# Patient Record
Sex: Male | Born: 1955 | Race: Black or African American | Hispanic: No | Marital: Married | Smoking: Former smoker
Health system: Southern US, Community
[De-identification: ages and names within clinical notes are randomized; demographics above are authoritative.]

## PROBLEM LIST (undated history)

## (undated) DIAGNOSIS — R490 Dysphonia: Secondary | ICD-10-CM

## (undated) HISTORY — DX: Dysphonia: R49.0

## (undated) HISTORY — PX: TONSILLECTOMY: SUR1361

---

## 2007-01-17 LAB — HM COLONOSCOPY: HM Colonoscopy: NORMAL

## 2011-10-19 DIAGNOSIS — J385 Laryngeal spasm: Secondary | ICD-10-CM | POA: Insufficient documentation

## 2012-01-17 ENCOUNTER — Encounter: Payer: Self-pay | Admitting: Internal Medicine

## 2012-01-17 ENCOUNTER — Ambulatory Visit (INDEPENDENT_AMBULATORY_CARE_PROVIDER_SITE_OTHER): Payer: Federal, State, Local not specified - PPO | Admitting: Internal Medicine

## 2012-01-17 VITALS — BP 100/70 | HR 96 | Temp 98.2°F | Resp 16 | Ht 70.0 in | Wt 161.2 lb

## 2012-01-17 DIAGNOSIS — R634 Abnormal weight loss: Secondary | ICD-10-CM

## 2012-01-17 DIAGNOSIS — K5900 Constipation, unspecified: Secondary | ICD-10-CM

## 2012-01-17 DIAGNOSIS — R490 Dysphonia: Secondary | ICD-10-CM

## 2012-01-17 MED ORDER — MEGESTROL ACETATE 40 MG/ML PO SUSP: 200.0000 mg | Freq: Every day | ORAL | Status: DC

## 2012-01-17 NOTE — Patient Instructions (Addendum)
I recommend adding citrucel or another mixable fiber supplement as needed to achieve bowel movements at least 2 or 3 per week,  Non straining

## 2012-01-17 NOTE — Progress Notes (Signed)
Patient ID: Nicholas Singleton, male   DOB: [birth year], 56 y.o.   MRN: [id number]  Patient Active Problem List  Diagnoses  . Dysphonia  . Weight loss, unintentional  . Constipation    Subjective:  CC:   Chief Complaint  Patient presents with  . New Patient    HPI:   Nicholas Singleton a 56 y.o. male who presents Is a new patient to establish primary care.  He has history of dysphonia for 8 years.  The condition was diagnosed 5 yrs ago after he sought ENT evaluation for recurrent respiratory infections.  He was referred to a Psychiatrist who prescribed medications that helped relax his vocal cords but eventually found his way to a neurologist who diagnosed dysphona.  His treatment has involved multiple drug trials which have been tried with limited success, including 2 Botox injections, once in [place] , once in February here by prior neurologist. Currently sees Nicholas Singleton,  At [facility].   Has recurrent trouble   Still taking Artane and Xenisene.  Has no trouble breathing while asleep and he apparently does not  desaturate according to his recall of prior sleep studies ,  but when awake he has to concentrate on his breathing. Relocated here one year ago from [place]. But this is a return to [place].  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     Cc wt loss of 20 lbs in the last year since moving  To [place].  he has had decreased appetite due to his medications. He does drink boost supplement daily. He denies abdominal pain nausea and has not change in bowel movements with only one stool per week on average which he had treats his medications. He had a normal colonoscopy in 2008. He is up-to-date on prostate cancer screening as well.   Past Medical History  Diagnosis Date  . Dysphonia     Past Surgical History  Procedure Date  . Tonsillectomy          The following portions of the patient's history were reviewed  and updated as appropriate: Allergies, current medications, and problem list.    Review of Systems:   12 Pt  review of systems was negative except those addressed in the HPI,     History   Social History  . Marital Status: Married    Spouse Name: N/A    Number of Children: N/A  . Years of Education: N/A   Occupational History  . Not on file.   Social History Main Topics  . Smoking status: Never Smoker   . Smokeless tobacco: Never Used  . Alcohol Use: Yes  . Drug Use: No  . Sexually Active: Not on file   Other Topics Concern  . Not on file   Social History Narrative  . No narrative on file    Objective:  BP 100/70  Pulse 96  Temp(Src) 98.2 F (36.8 C) (Oral)  Resp 16  Ht 5\' 10"  (1.778 m)  Wt 161 lb 4 oz (73.143 kg)  BMI 23.14 kg/m2  SpO2 95%  General appearance: alert, cooperative and appears stated age Ears: normal TM's and external ear canals both ears Throat: lips, mucosa, and tongue normal; teeth and gums normal Neck: no adenopathy, no carotid bruit, supple, symmetrical, trachea midline and thyroid not enlarged, symmetric, no tenderness/mass/nodules Back: symmetric, no curvature. ROM normal. No CVA tenderness. Lungs: clear to auscultation bilaterally Heart:  regular rate and rhythm, S1, S2 normal, no murmur, click, rub or gallop Abdomen: soft, non-tender; bowel sounds normal; no masses,  no organomegaly Pulses: 2+ and symmetric Skin: Skin color, texture, turgor normal. No rashes or lesions Lymph nodes: Cervical, supraclavicular, and axillary nodes normal.  Assessment and Plan:  Weight loss, unintentional He feels that his weight loss, although unintentional, can be explained by his poor appetite. His medications cause him to lose his appetite. He is not underweight as his BMI is greater than 20. We discussed a trial of an appetite stimulating medication such as Megace. This fails to achieve some plateau and over the ankle in weight we will consider  workup for occult malignancy this was discussed with patient today  Dysphonia Managed by Nicholas Singleton with tetrabenazine and Artane.  Constipation His medications have been causing constipation he is married and one bowel movement per week. I recommended that he start using a fiber supplement on a daily basis with a goal to 3 stools per week. Also recommend that he increase his water intake to 11/05/2014 ounce bottles daily minimum.    Updated Medication List Outpatient Encounter Prescriptions as of 01/17/2012  Medication Sig Dispense Refill  . amitriptyline (ELAVIL) 10 MG tablet Take 10 mg by mouth at bedtime.      . Calcium Carbonate-Vit D-Min (CALTRATE 600+D PLUS) 600-400 MG-UNIT per tablet Take 1 tablet by mouth daily.      . Eszopiclone (ESZOPICLONE) 3 MG TABS Take 3 mg by mouth at bedtime. Take immediately before bedtime      . feeding supplement (BOOST HIGH PROTEIN) LIQD Take 1 Container by mouth 3 (three) times daily between meals.      . megestrol (MEGACE ORAL) 40 MG/ML suspension Take 5 mLs (200 mg total) by mouth daily.  480 mL  0  . Multiple Vitamin (MULTIVITAMIN) tablet Take 1 tablet by mouth daily.      Marland Kitchen tetrabenazine (XENAZINE) 25 MG tablet Take 25 mg by mouth 4 (four) times daily.      . trihexyphenidyl (ARTANE) 2 MG tablet Take 4 mg by mouth 4 (four) times daily.         Orders Placed This Encounter  Procedures  . HM COLONOSCOPY    No Follow-up on file.

## 2012-01-18 ENCOUNTER — Encounter: Payer: Self-pay | Admitting: Internal Medicine

## 2012-01-18 DIAGNOSIS — R490 Dysphonia: Secondary | ICD-10-CM | POA: Insufficient documentation

## 2012-01-18 DIAGNOSIS — K5909 Other constipation: Secondary | ICD-10-CM | POA: Insufficient documentation

## 2012-01-18 DIAGNOSIS — R634 Abnormal weight loss: Secondary | ICD-10-CM | POA: Insufficient documentation

## 2012-01-18 NOTE — Assessment & Plan Note (Addendum)
Managed by Dr. Jeannetta Nap with tetrabenazine and Artane.

## 2012-01-18 NOTE — Assessment & Plan Note (Signed)
His medications have been causing constipation he is married and one bowel movement per week. I recommended that he start using a fiber supplement on a daily basis with a goal to 3 stools per week. Also recommend that he increase his water intake to 11/05/2014 ounce bottles daily minimum.

## 2012-01-18 NOTE — Assessment & Plan Note (Signed)
He feels that his weight loss, although unintentional, can be explained by his poor appetite. His medications cause him to lose his appetite. He is not underweight as his BMI is greater than 20. We discussed a trial of an appetite stimulating medication such as Megace. This fails to achieve some plateau and over the ankle in weight we will consider workup for occult malignancy this was discussed with patient today

## 2012-02-01 ENCOUNTER — Telehealth: Payer: Self-pay | Admitting: Internal Medicine

## 2012-02-01 NOTE — Telephone Encounter (Signed)
Do you want me to fill out handicap sticker for patient?  Temporary or permanent?

## 2012-02-01 NOTE — Telephone Encounter (Signed)
Permament handicap sticker,  thanks

## 2012-02-01 NOTE — Telephone Encounter (Signed)
Patient called in states when he was here last week he spoke with Dr. Darrick Huntsman about a handicap sticker.  He would like to know the status on this.

## 2012-02-02 NOTE — Telephone Encounter (Signed)
Placard filled out, patient notified.

## 2012-02-29 ENCOUNTER — Other Ambulatory Visit (INDEPENDENT_AMBULATORY_CARE_PROVIDER_SITE_OTHER): Payer: Federal, State, Local not specified - PPO | Admitting: *Deleted

## 2012-02-29 ENCOUNTER — Telehealth: Payer: Self-pay | Admitting: Internal Medicine

## 2012-02-29 DIAGNOSIS — R5381 Other malaise: Secondary | ICD-10-CM

## 2012-02-29 DIAGNOSIS — E039 Hypothyroidism, unspecified: Secondary | ICD-10-CM

## 2012-02-29 DIAGNOSIS — E785 Hyperlipidemia, unspecified: Secondary | ICD-10-CM

## 2012-02-29 DIAGNOSIS — E46 Unspecified protein-calorie malnutrition: Secondary | ICD-10-CM

## 2012-02-29 DIAGNOSIS — R5383 Other fatigue: Secondary | ICD-10-CM

## 2012-02-29 NOTE — Telephone Encounter (Signed)
Message copied by Duncan Dull on Thu Feb 29, 2012  2:54 PM ------      Message from: Baldomero Lamy      Created: Thu Feb 29, 2012 10:00 AM      Regarding: Physical labs today       Pt had physical labs today, please order.            Thanks      Rodney Booze

## 2012-03-01 LAB — CBC WITH DIFFERENTIAL/PLATELET
Basophils Absolute: 0 10*3/uL (ref 0.0–0.1)
Eosinophils Absolute: 0 10*3/uL (ref 0.0–0.7)
Hemoglobin: 12.5 g/dL — ABNORMAL LOW (ref 13.0–17.0)
Lymphocytes Relative: 26.8 % (ref 12.0–46.0)
MCHC: 32.5 g/dL (ref 30.0–36.0)
Neutro Abs: 3.2 10*3/uL (ref 1.4–7.7)
Neutrophils Relative %: 69.3 % (ref 43.0–77.0)
RDW: 14.3 % (ref 11.5–14.6)

## 2012-03-01 LAB — COMPREHENSIVE METABOLIC PANEL
ALT: 25 U/L (ref 0–53)
AST: 24 U/L (ref 0–37)
Albumin: 4.3 g/dL (ref 3.5–5.2)
Calcium: 9.9 mg/dL (ref 8.4–10.5)
Chloride: 105 mEq/L (ref 96–112)
Potassium: 4.5 mEq/L (ref 3.5–5.1)
Sodium: 138 mEq/L (ref 135–145)

## 2012-03-01 LAB — LIPID PANEL
LDL Cholesterol: 122 mg/dL — ABNORMAL HIGH (ref 0–99)
Total CHOL/HDL Ratio: 3

## 2012-03-15 ENCOUNTER — Encounter: Payer: Federal, State, Local not specified - PPO | Admitting: Internal Medicine

## 2012-04-18 ENCOUNTER — Ambulatory Visit (INDEPENDENT_AMBULATORY_CARE_PROVIDER_SITE_OTHER): Payer: Federal, State, Local not specified - PPO | Admitting: Internal Medicine

## 2012-04-18 ENCOUNTER — Encounter: Payer: Self-pay | Admitting: Internal Medicine

## 2012-04-18 VITALS — BP 110/80 | HR 99 | Temp 97.8°F | Resp 14 | Wt 165.0 lb

## 2012-04-18 DIAGNOSIS — R634 Abnormal weight loss: Secondary | ICD-10-CM

## 2012-04-18 DIAGNOSIS — Z125 Encounter for screening for malignant neoplasm of prostate: Secondary | ICD-10-CM

## 2012-04-18 DIAGNOSIS — Z0000 Encounter for general adult medical examination without abnormal findings: Secondary | ICD-10-CM

## 2012-04-18 DIAGNOSIS — D649 Anemia, unspecified: Secondary | ICD-10-CM

## 2012-04-18 DIAGNOSIS — Z23 Encounter for immunization: Secondary | ICD-10-CM

## 2012-04-18 NOTE — Patient Instructions (Addendum)
Please return the take home stool test at your leisure as your screening or colon CA  We will call you with the results of your PSA   Return in 6 months,  Sooner if needed

## 2012-04-18 NOTE — Progress Notes (Signed)
Patient ID: Nicholas Singleton, male   DOB: 11-Mar-1956, 56 y.o.   MRN: [id number]   . Patient Active Problem List  Diagnosis  . Dysphonia  . Weight loss, unintentional  . Constipation, chronic  . Routine general medical examination at a health care facility    Subjective:  CC:   Chief Complaint  Patient presents with  . Annual Exam    HPI:   Nicholas Singleton a 56 y.o. male who presents for his annual male exam. He has no new complaints. He is a bit lethargic today because he took his morning meds quite late but also took his early afternoon meds at the usual at the usual time.  His constipation has improved with use of daily fiber laxative.   Past Medical History  Diagnosis Date  . Dysphonia     Past Surgical History  Procedure Date  . Tonsillectomy          The following portions of the patient's history were reviewed and updated as appropriate: Allergies, current medications, and problem list.    Review of Systems:   A comprehensive ROS was done and positive for improving constipation.   The rest was negative.      History   Social History  . Marital Status: Married    Spouse Name: N/A    Number of Children: N/A  . Years of Education: N/A   Occupational History  . Not on file.   Social History Main Topics  . Smoking status: Never Smoker   . Smokeless tobacco: Never Used  . Alcohol Use: Yes  . Drug Use: No  . Sexually Active: Not on file   Other Topics Concern  . Not on file   Social History Narrative  . No narrative on file    Objective:  BP 110/80  Pulse 99  Temp 97.8 F (36.6 C) (Oral)  Resp 14  Wt 165 lb (74.844 kg)  SpO2 98%  General Appearance:    Alert, cooperative, no distress, appears stated age  Head:    Normocephalic, without obvious abnormality, atraumatic  Eyes:    PERRL, conjunctiva/corneas clear, EOM's intact, fundi    benign, both eyes       Ears:    Normal TM's and external ear canals, both ears  Nose:   Nares normal,  septum midline, mucosa normal, no drainage   or sinus tenderness  Throat:   Lips, mucosa, and tongue normal; teeth and gums normal  Neck:   Supple, symmetrical, trachea midline, no adenopathy;       thyroid:  No enlargement/tenderness/nodules; no carotid   bruit or JVD  Back:     Symmetric, no curvature, ROM normal, no CVA tenderness  Lungs:     Clear to auscultation bilaterally, respirations unlabored  Chest wall:    No tenderness or deformity  Heart:    Regular rate and rhythm, S1 and S2 normal, no murmur, rub   or gallop  Abdomen:     Soft, non-tender, bowel sounds active all four quadrants,    no masses, no organomegaly  Genitalia:    Normal male without lesion, discharge or tenderness  Rectal:    Normal tone, normal prostate, no masses or tenderness;    Extremities:   Extremities normal, atraumatic, no cyanosis or edema  Pulses:   2+ and symmetric all extremities  Skin:   Skin color, texture, turgor normal, no rashes or lesions  Lymph nodes:   Cervical, supraclavicular, and axillary nodes normal  Neurologic:  CNII-XII intact. Normal strength, sensation and reflexes      throughout    Assessment and Plan:  Weight loss, unintentional His weight loss has resolved and he is actually gained 2 pounds since last visit. He attributes this to the appetite stimulant I prescribed. He's had no side effects from it.  Routine general medical examination at a health care facility Testicular and prostate exam were both done today during the patient's annual PE exam. There were no abnormalities noted.   Updated Medication List Outpatient Encounter Prescriptions as of 04/18/2012  Medication Sig Dispense Refill  . amitriptyline (ELAVIL) 10 MG tablet Take 10 mg by mouth at bedtime.      . Eszopiclone (ESZOPICLONE) 3 MG TABS Take 3 mg by mouth at bedtime. Take immediately before bedtime      . megestrol (MEGACE ORAL) 40 MG/ML suspension Take 5 mLs (200 mg total) by mouth daily.  480 mL  0  .  Multiple Vitamin (MULTIVITAMIN) tablet Take 1 tablet by mouth daily.      Marland Kitchen tetrabenazine (XENAZINE) 25 MG tablet Take 25 mg by mouth 4 (four) times daily.      . trihexyphenidyl (ARTANE) 2 MG tablet Take 4 mg by mouth 4 (four) times daily.      Marland Kitchen DISCONTD: Calcium Carbonate-Vit D-Min (CALTRATE 600+D PLUS) 600-400 MG-UNIT per tablet Take 1 tablet by mouth daily.      Marland Kitchen DISCONTD: feeding supplement (BOOST HIGH PROTEIN) LIQD Take 1 Container by mouth 3 (three) times daily between meals.         Orders Placed This Encounter  Procedures  . Tdap vaccine greater than or equal to 7yo IM  . PSA  . Ferritin  . Iron and TIBC  . Vitamin B12  . PSA, total and free    No Follow-up on file.      \

## 2012-04-19 LAB — PSA, TOTAL AND FREE
PSA, Free Pct: 21 % — ABNORMAL LOW (ref >25)
PSA, Free: 0.15 ng/mL

## 2012-04-19 LAB — IRON AND TIBC: Iron: 111 ug/dL (ref 42–165)

## 2012-04-21 DIAGNOSIS — Z0000 Encounter for general adult medical examination without abnormal findings: Secondary | ICD-10-CM | POA: Insufficient documentation

## 2012-04-21 NOTE — Assessment & Plan Note (Signed)
His weight loss has resolved and he is actually gained 2 pounds since last visit. He attributes this to the appetite stimulant I prescribed. He's had no side effects from it.

## 2012-04-21 NOTE — Assessment & Plan Note (Signed)
Testicular and prostate exam were both done today during the patient's annual PE exam. There were no abnormalities noted.

## 2012-10-21 ENCOUNTER — Ambulatory Visit (INDEPENDENT_AMBULATORY_CARE_PROVIDER_SITE_OTHER): Payer: Federal, State, Local not specified - PPO | Admitting: Internal Medicine

## 2012-10-21 ENCOUNTER — Encounter: Payer: Self-pay | Admitting: Internal Medicine

## 2012-10-21 VITALS — BP 120/78 | HR 75 | Temp 98.2°F | Resp 16 | Ht 67.25 in | Wt 173.2 lb

## 2012-10-21 DIAGNOSIS — R490 Dysphonia: Secondary | ICD-10-CM

## 2012-10-21 DIAGNOSIS — T50905A Adverse effect of unspecified drugs, medicaments and biological substances, initial encounter: Secondary | ICD-10-CM

## 2012-10-21 DIAGNOSIS — R638 Other symptoms and signs concerning food and fluid intake: Secondary | ICD-10-CM

## 2012-10-21 DIAGNOSIS — N529 Male erectile dysfunction, unspecified: Secondary | ICD-10-CM

## 2012-10-21 DIAGNOSIS — R635 Abnormal weight gain: Secondary | ICD-10-CM

## 2012-10-21 DIAGNOSIS — N522 Drug-induced erectile dysfunction: Secondary | ICD-10-CM

## 2012-10-21 DIAGNOSIS — R634 Abnormal weight loss: Secondary | ICD-10-CM

## 2012-10-21 MED ORDER — SILDENAFIL CITRATE 50 MG PO TABS: 50.0000 mg | ORAL_TABLET | Freq: Every day | ORAL | Status: DC | PRN

## 2012-10-21 NOTE — Progress Notes (Signed)
Patient ID: Nicholas Singleton, male   DOB: 1956-07-15, 57 y.o.   MRN: [id number]   Patient Active Problem List  Diagnosis  . Dysphonia  . Constipation, chronic  . Routine general medical examination at a health care facility  . Drug-induced impotence  . Weight gain due to medication    Subjective:  CC:   Chief Complaint  Patient presents with  . Follow-up    HPI:   Nicholas Singleton a 57 y.o. male who presents for 6 month follow up on chronic issues including insomnia, weight  loss and dysphonia. 1) he continues to mange his insomnia with elavil and lunesta, averages 8 hours per night.  He has gained 8 lbs since last visit.  He is no longer taking the megace and his appetite is healthy.  He exercises several days per week (about 3) on days when his dysphonia is minimal but continues to have several days per week when he is unable to do much.  The condition was diagnosed 5 yrs ago after he sought ENT evaluation for recurrent respiratory infections.  He was referred to a psychiatrist who prescribed medications that helped relax his vocal cords but eventually found his way to a neurologist who diagnosed dysphonia. He has no trouble breathing while asleep and he apparently does not  desaturate according to his recall of prior sleep studies. His treatment has involved multiple drug trials which resulted in limited success, including  Botox injections.  He currently sees Dr. Jeannetta Nap at [facility].   He continues to take Artane and Xenisene and is having difficulty maintaining erections due to the medications.  He is requesting a trial of medication for impotence.  He is also requesting referral to [facility]/ENT for ongoing management of dysphonia.    Past Medical History  Diagnosis Date  . Dysphonia     Past Surgical History  Procedure Laterality Date  . Tonsillectomy      The following portions of the patient's history were reviewed and updated as appropriate: Allergies,  current medications, and problem list.    Review of Systems:   Patient denies headache, fevers, malaise, unintentional weight loss, skin rash, eye pain, sinus congestion and sinus pain, sore throat, dysphagia,  hemoptysis , cough, dyspnea, wheezing, chest pain, palpitations, orthopnea, edema, abdominal pain, nausea, melena, diarrhea, constipation, flank pain, dysuria, hematuria, urinary  Frequency, nocturia, numbness, tingling, seizures,  Focal weakness, Loss of consciousness,  Tremor, insomnia, depression, anxiety, and suicidal ideation.     History   Social History  . Marital Status: Married    Spouse Name: N/A    Number of Children: N/A  . Years of Education: N/A   Occupational History  . attorney    Social History Main Topics  . Smoking status: Never Smoker   . Smokeless tobacco: Never Used  . Alcohol Use: Yes  . Drug Use: No  . Sexually Active: Not on file   Other Topics Concern  . Not on file   Social History Narrative  . No narrative on file    Objective:  BP 120/78  Pulse 75  Temp(Src) 98.2 F (36.8 C) (Oral)  Resp 16  Ht 5' 7.25" (1.708 m)  Wt 173 lb 4 oz (78.586 kg)  BMI 26.94 kg/m2  SpO2 99%  General appearance: alert, cooperative and appears stated age.  Ears: normal TM's and external ear canals both ears Throat: lips, mucosa, and tongue normal; teeth and gums normal Neck: no adenopathy, no carotid bruit, supple, symmetrical, trachea  midline and thyroid not enlarged, symmetric, no tenderness/mass/nodules Back: symmetric, no curvature. ROM normal. No CVA tenderness. Lungs: clear to auscultation bilaterally Heart: regular rate and rhythm, S1, S2 normal, no murmur, click, rub or gallop Abdomen: soft, non-tender; bowel sounds normal; no masses,  no organomegaly Pulses: 2+ and symmetric Skin: Skin color, texture, turgor normal. No rashes or lesions Lymph nodes: Cervical, supraclavicular, and axillary nodes normal.  Assessment and  Plan:  Dysphonia Managed with botox injections by ENT and neurologist in [facility]. Wants to have a second opinion with referral to [facility].  Will refer to Dr. Noel Gerold  At the [facility].  Records from [facility] were never received and requested today.   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  Risks and benefits of PDE's discussed.  Trial of viagra.   Weight gain due to medication 12 lb wt gain since first visit,  BMI now 26 .  Ideal weight discussed (165 lbs) and diet/exercise plan discussed.    Updated Medication List Outpatient Encounter Prescriptions as of 10/21/2012  Medication Sig Dispense Refill  . amitriptyline (ELAVIL) 10 MG tablet Take 10 mg by mouth at bedtime.      . Eszopiclone (ESZOPICLONE) 3 MG TABS Take 3 mg by mouth at bedtime. Take immediately before bedtime      . Multiple Vitamin (MULTIVITAMIN) tablet Take 1 tablet by mouth daily.      Marland Kitchen tetrabenazine (XENAZINE) 25 MG tablet Take 25 mg by mouth 4 (four) times daily.      . trihexyphenidyl (ARTANE) 2 MG tablet Take 4 mg by mouth 4 (four) times daily.      . [DISCONTINUED] megestrol (MEGACE ORAL) 40 MG/ML suspension Take 5 mLs (200 mg total) by mouth daily.  480 mL  0  . sildenafil (VIAGRA) 50 MG tablet Take 1 tablet (50 mg total) by mouth daily as needed for erectile dysfunction.  3 tablet  0  . [DISCONTINUED] sildenafil (VIAGRA) 50 MG tablet Take 1 tablet (50 mg total) by mouth daily as needed for erectile dysfunction.  10 tablet  0   No facility-administered encounter medications on file as of 10/21/2012.     Orders Placed This Encounter  Procedures  . Ambulatory referral to ENT    No Follow-up on file.

## 2012-10-21 NOTE — Assessment & Plan Note (Signed)
12 lb wt gain since first visit,  BMI now 26 .  Ideal weight discussed (165 lbs) and diet/exercise plan discussed.

## 2012-10-21 NOTE — Assessment & Plan Note (Signed)
Secondary to meds.  Risks and benefits of PDE's discussed.  Trial of viagra.

## 2012-10-21 NOTE — Patient Instructions (Addendum)
I recommend one calcium supplement daily one MVI  (goal 800 IUs of Vit d daily )    Referral to Lenard Simmer  At the [facility] (he is the head)  Your exercise goal is a minimum  Of 30 minutes of aerobic exercise 5 days per week

## 2012-10-21 NOTE — Assessment & Plan Note (Addendum)
Managed with botox injections by ENT and neurologist in [facility]. Wants to have a second opinion with referral to [facility].  Will refer to Dr. Noel Gerold  At the [facility].  Records from [facility] were never received and requested today.

## 2012-11-05 ENCOUNTER — Telehealth: Payer: Self-pay | Admitting: *Deleted

## 2012-11-05 NOTE — Telephone Encounter (Signed)
Pt is coming in for labs tomorrow 03.05.2014 what labs and dx would you like? Thank you  

## 2012-11-06 ENCOUNTER — Other Ambulatory Visit (INDEPENDENT_AMBULATORY_CARE_PROVIDER_SITE_OTHER): Payer: Federal, State, Local not specified - PPO

## 2012-11-06 LAB — LIPID PANEL
HDL: 57 mg/dL (ref >39.00)
Total CHOL/HDL Ratio: 4
Triglycerides: 55 mg/dL (ref 0.0–149.0)

## 2012-11-06 LAB — COMPREHENSIVE METABOLIC PANEL
ALT: 30 U/L (ref 0–53)
CO2: 27 mEq/L (ref 19–32)
Calcium: 9.4 mg/dL (ref 8.4–10.5)
Chloride: 101 mEq/L (ref 96–112)
Creatinine, Ser: 1.4 mg/dL (ref 0.4–1.5)
GFR: 69.54 mL/min (ref >60.00)
Glucose, Bld: 89 mg/dL (ref 70–99)
Total Bilirubin: 0.6 mg/dL (ref 0.3–1.2)
Total Protein: 7.7 g/dL (ref 6.0–8.3)

## 2012-11-06 LAB — CBC WITH DIFFERENTIAL/PLATELET
Basophils Absolute: 0 10*3/uL (ref 0.0–0.1)
Eosinophils Relative: 0.7 % (ref 0.0–5.0)
HCT: 39.8 % (ref 39.0–52.0)
Hemoglobin: 13.1 g/dL (ref 13.0–17.0)
Lymphocytes Relative: 28.8 % (ref 12.0–46.0)
Lymphs Abs: 1.4 10*3/uL (ref 0.7–4.0)
Monocytes Relative: 7.9 % (ref 3.0–12.0)
Neutro Abs: 3.1 10*3/uL (ref 1.4–7.7)
RBC: 4.61 Mil/uL (ref 4.22–5.81)
RDW: 14.5 % (ref 11.5–14.6)
WBC: 5 10*3/uL (ref 4.5–10.5)

## 2012-11-06 LAB — LDL CHOLESTEROL, DIRECT: Direct LDL: 155.3 mg/dL

## 2012-11-06 NOTE — Telephone Encounter (Signed)
Orders in 

## 2012-11-06 NOTE — Addendum Note (Signed)
Addended by: Sherlene Shams on: 11/06/2012 07:09 AM   Modules accepted: Orders

## 2012-11-28 DIAGNOSIS — G249 Dystonia, unspecified: Secondary | ICD-10-CM | POA: Insufficient documentation

## 2013-08-04 ENCOUNTER — Emergency Department: Payer: Self-pay | Admitting: Emergency Medicine

## 2013-08-26 DIAGNOSIS — R4189 Other symptoms and signs involving cognitive functions and awareness: Secondary | ICD-10-CM | POA: Insufficient documentation

## 2013-11-19 ENCOUNTER — Encounter: Payer: Self-pay | Admitting: Internal Medicine

## 2013-11-19 ENCOUNTER — Encounter (INDEPENDENT_AMBULATORY_CARE_PROVIDER_SITE_OTHER): Payer: Self-pay

## 2013-11-19 ENCOUNTER — Ambulatory Visit (INDEPENDENT_AMBULATORY_CARE_PROVIDER_SITE_OTHER): Payer: Federal, State, Local not specified - PPO | Admitting: Internal Medicine

## 2013-11-19 VITALS — BP 108/76 | HR 81 | Temp 98.4°F | Resp 18 | Wt 169.5 lb

## 2013-11-19 DIAGNOSIS — Z79899 Other long term (current) drug therapy: Secondary | ICD-10-CM

## 2013-11-19 DIAGNOSIS — B351 Tinea unguium: Secondary | ICD-10-CM

## 2013-11-19 LAB — COMPREHENSIVE METABOLIC PANEL
ALBUMIN: 4.4 g/dL (ref 3.5–5.2)
ALT: 39 U/L (ref 0–53)
AST: 29 U/L (ref 0–37)
Alkaline Phosphatase: 52 U/L (ref 39–117)
BUN: 15 mg/dL (ref 6–23)
CALCIUM: 9.8 mg/dL (ref 8.4–10.5)
CHLORIDE: 101 meq/L (ref 96–112)
CO2: 32 meq/L (ref 19–32)
CREATININE: 1.01 mg/dL (ref 0.50–1.35)
Glucose, Bld: 79 mg/dL (ref 70–99)
POTASSIUM: 4.7 meq/L (ref 3.5–5.3)
Sodium: 137 mEq/L (ref 135–145)
TOTAL PROTEIN: 7.4 g/dL (ref 6.0–8.3)
Total Bilirubin: 0.4 mg/dL (ref 0.2–1.2)

## 2013-11-19 NOTE — Progress Notes (Signed)
Patient ID: Nicholas Singleton, male   DOB: [birth year], 59 y.o.   MRN: [id number]  Patient Active Problem List   Diagnosis Date Noted  . Onychomycosis 11/20/2013  . 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 Weight gain due to medication 10/21/2012  . Routine general medical examination at a health care facility 04/21/2012  . Constipation, chronic 01/18/2012  . Dysphonia     Subjective:  CC:   Chief Complaint  Patient presents with  . Acute Visit    all toes are infected stated by patient.    HPI:   Nicholas Singleton is a 58 y.o. male who presents for  Follow up on treatment for Onychomycosis that was under the management by his  podiatrist who died a month ago.  Dr Jetta Lout in [place]  Prescribed  Medication in December 8th after culture was obtained during surgery in November to remove corns  From right foot,  He has been taking terbinafine and ketoconazole for about two months and hasn't seen a difference. Toe nails are very slow growing. Wants to know if there is a more effective treatment , specifically Wants to inquire about the publicly advertised jubilee      Past Medical History  Diagnosis Date  . Dysphonia     Past Surgical History  Procedure Laterality Date  . Tonsillectomy         The following portions of the patient's history were reviewed and updated as appropriate: Allergies, current medications, and problem list.    Review of Systems:   Patient denies headache, fevers, malaise, unintentional weight loss, skin rash, eye pain, sinus congestion and sinus pain, sore throat, dysphagia,  hemoptysis , cough, dyspnea, wheezing, chest pain, palpitations, orthopnea, edema, abdominal pain, nausea, melena, diarrhea, constipation, flank pain, dysuria, hematuria, urinary  Frequency, nocturia, numbness, tingling, seizures,  Focal weakness, Loss of consciousness,  Tremor, insomnia, depression, anxiety, and suicidal ideation.     History   Social History  . Marital Status:  Married    Spouse Name: N/A    Number of Children: N/A  . Years of Education: N/A   Occupational History  . attorney    Social History Main Topics  . Smoking status: Never Smoker   . Smokeless tobacco: Never Used  . Alcohol Use: Yes  . Drug Use: No  . Sexual Activity: Not on file   Other Topics Concern  . Not on file   Social History Narrative  . No narrative on file    Objective:  Filed Vitals:   11/19/13 1028  BP: 108/76  Pulse: 81  Temp: 98.4 F (36.9 C)  Resp: 18     General appearance: alert, cooperative and appears stated age Ears: normal TM's and external ear canals both ears Throat: lips, mucosa, and tongue normal; teeth and gums normal Neck: no adenopathy, no carotid bruit, supple, symmetrical, trachea midline and thyroid not enlarged, symmetric, no tenderness/mass/nodules Back: symmetric, no curvature. ROM normal. No CVA tenderness. Lungs: clear to auscultation bilaterally Heart: regular rate and rhythm, S1, S2 normal, no murmur, click, rub or gallop Abdomen: soft, non-tender; bowel sounds normal; no masses,  no organomegaly Pulses: 2+ and symmetric Skin: Skin color, texture, turgor normal. No rashes or lesions Lymph nodes: Cervical, supraclavicular, and axillary nodes normal. Ext:  Toenails dystrophic,  Thickened, opaque  Assessment and Plan:  Onychomycosis Presumed by history,.  Records not available. Toenail clippings sent off today for verification.  CMET normal.  Will resume terbinafine pending culture results.    Updated  Medication List Outpatient Encounter Prescriptions as of 11/19/2013  Medication Sig  . ALPRAZolam (XANAX) 0.5 MG tablet Take 0.25 mg by mouth 4 (four) times daily.  Marland Kitchen amitriptyline (ELAVIL) 10 MG tablet Take 10 mg by mouth at bedtime.  . Eszopiclone (ESZOPICLONE) 3 MG TABS Take 3 mg by mouth at bedtime. Take immediately before bedtime  . glycopyrrolate (ROBINUL) 1 MG tablet Take 1 mg by mouth 2 (two) times daily.  Marland Kitchen  ketoconazole (NIZORAL) 2 % cream Apply 1 application topically daily.  . Multiple Vitamin (MULTIVITAMIN) tablet Take 1 tablet by mouth daily.  . sildenafil (VIAGRA) 50 MG tablet Take 1 tablet (50 mg total) by mouth daily as needed for erectile dysfunction.  . terbinafine (LAMISIL) 250 MG tablet Take 1 tablet (250 mg total) by mouth daily.  Marland Kitchen tetrabenazine (XENAZINE) 25 MG tablet Take 12.5 mg by mouth 4 (four) times daily.   . trihexyphenidyl (ARTANE) 2 MG tablet Take 1 mg by mouth 4 (four) times daily.   . [DISCONTINUED] terbinafine (LAMISIL) 250 MG tablet Take 250 mg by mouth daily.      Orders Placed This Encounter  Procedures  . Culture, fungus without smear  . Comp Met (CMET)    No Follow-up on file.

## 2013-11-19 NOTE — Patient Instructions (Signed)

## 2013-11-20 ENCOUNTER — Encounter: Payer: Self-pay | Admitting: *Deleted

## 2013-11-20 ENCOUNTER — Encounter: Payer: Self-pay | Admitting: Internal Medicine

## 2013-11-20 DIAGNOSIS — B351 Tinea unguium: Secondary | ICD-10-CM | POA: Insufficient documentation

## 2013-11-20 MED ORDER — TERBINAFINE HCL 250 MG PO TABS: 250.0000 mg | ORAL_TABLET | Freq: Every day | ORAL | Status: DC

## 2013-11-20 NOTE — Addendum Note (Signed)
Addended by: Liane ComberHAVERS, Buck Mcaffee C on: 11/20/2013 01:50 PM   Modules accepted: Orders

## 2013-11-20 NOTE — Assessment & Plan Note (Signed)
Presumed by history,.  Records not available. Toenail clippings sent off today for verification.  CMET normal.  Will resume terbinafine pending culture results.

## 2013-12-19 LAB — CULTURE, FUNGUS WITHOUT SMEAR

## 2013-12-19 NOTE — Addendum Note (Signed)
Addended by: Sherlene ShamsULLO, Hilario Robarts L on: 12/19/2013 01:34 PM   Modules accepted: Orders

## 2013-12-29 ENCOUNTER — Encounter: Payer: Self-pay | Admitting: Internal Medicine

## 2013-12-29 ENCOUNTER — Ambulatory Visit (INDEPENDENT_AMBULATORY_CARE_PROVIDER_SITE_OTHER): Payer: Federal, State, Local not specified - PPO | Admitting: Internal Medicine

## 2013-12-29 VITALS — BP 112/72 | HR 83 | Temp 98.3°F | Resp 16 | Ht 69.25 in | Wt 165.5 lb

## 2013-12-29 DIAGNOSIS — Z0000 Encounter for general adult medical examination without abnormal findings: Secondary | ICD-10-CM

## 2013-12-29 DIAGNOSIS — N400 Enlarged prostate without lower urinary tract symptoms: Secondary | ICD-10-CM

## 2013-12-29 DIAGNOSIS — Z79899 Other long term (current) drug therapy: Secondary | ICD-10-CM

## 2013-12-29 DIAGNOSIS — B351 Tinea unguium: Secondary | ICD-10-CM

## 2013-12-29 DIAGNOSIS — Z125 Encounter for screening for malignant neoplasm of prostate: Secondary | ICD-10-CM

## 2013-12-29 DIAGNOSIS — E559 Vitamin D deficiency, unspecified: Secondary | ICD-10-CM

## 2013-12-29 DIAGNOSIS — E785 Hyperlipidemia, unspecified: Secondary | ICD-10-CM

## 2013-12-29 LAB — COMPREHENSIVE METABOLIC PANEL
ALBUMIN: 4.6 g/dL (ref 3.5–5.2)
ALT: 35 U/L (ref 0–53)
AST: 33 U/L (ref 0–37)
Alkaline Phosphatase: 48 U/L (ref 39–117)
BUN: 17 mg/dL (ref 6–23)
CALCIUM: 10 mg/dL (ref 8.4–10.5)
CHLORIDE: 107 meq/L (ref 96–112)
CO2: 25 meq/L (ref 19–32)
Creatinine, Ser: 1.2 mg/dL (ref 0.4–1.5)
GFR: 83.21 mL/min (ref >60.00)
GLUCOSE: 144 mg/dL — AB (ref 70–99)
POTASSIUM: 3.8 meq/L (ref 3.5–5.1)
Sodium: 147 mEq/L — ABNORMAL HIGH (ref 135–145)
Total Bilirubin: 0.4 mg/dL (ref 0.3–1.2)
Total Protein: 8 g/dL (ref 6.0–8.3)

## 2013-12-29 LAB — LDL CHOLESTEROL, DIRECT: Direct LDL: 155.2 mg/dL

## 2013-12-29 NOTE — Assessment & Plan Note (Signed)
Annual male exam was done including testicular and prostate exam. PSA is pending .  Colon ca screening was reviewed and options given.   Lab Results  Component Value Date   PSA 0.70 04/18/2012

## 2013-12-29 NOTE — Progress Notes (Signed)
Patient ID: Nicholas Singleton, male   DOB: 11/02/1955, 58 y.o.   MRN: [id number]    The patient is here for his annual male physical examination and management of other chronic and acute problems.   The risk factors are reflected in the social history.  The roster of all physicians providing medical care to patient - is listed in the Snapshot section of the chart.  Activities of daily living:  The patient is 100% independent in all ADLs: dressing, toileting, feeding as well as independent mobility  Home safety : The patient has smoke detectors in the home. He wears seatbelts.  There are no firearms at home. There is no violence in the home.   There is no risks for hepatitis, STDs or HIV. There is no   history of blood transfusion. There is no travel history to infectious disease endemic areas of the world.  The patient has seen their dentist in the last six month and  their eye doctor in the last year.  They do not  have excessive sun exposure. They have seen a dermatoloigist in the last year. Discussed the need for sun protection: hats, long sleeves and use of sunscreen if there is significant sun exposure.   Diet: the importance of a healthy diet is discussed. They do have a healthy diet.  The benefits of regular aerobic exercise were discussed. He exercises a minimum of 30 minutes  5 days per week. Depression screen: there are no signs or vegative symptoms of depression- irritability, change in appetite, anhedonia, sadness/tearfullness.  The following portions of the patient's history were reviewed and updated as appropriate: allergies, current medications, past family history, past medical history,  past surgical history, past social history  and problem list.  Visual acuity was not assessed per patient preference since he has regular follow up with his ophthalmologist. Hearing and body mass index were assessed and reviewed.   During the course of the visit the patient was educated and  counseled about appropriate screening and preventive services including :  nutrition counseling, colorectal cancer screening, and recommended immunizations.    Objective:  BP 112/72  Pulse 83  Temp(Src) 98.3 F (36.8 C) (Oral)  Resp 16  Ht 5' 9.25" (1.759 m)  Wt 165 lb 8 oz (75.07 kg)  BMI 24.26 kg/m2  SpO2 98%  General Appearance:    Alert, cooperative, no distress, appears stated age  Head:    Normocephalic, without obvious abnormality, atraumatic  Eyes:    PERRL, conjunctiva/corneas clear, EOM's intact, fundi    benign, both eyes       Ears:    Normal TM's and external ear canals, both ears  Nose:   Nares normal, septum midline, mucosa normal, no drainage   or sinus tenderness  Throat:   Lips, mucosa, and tongue normal; teeth and gums normal  Neck:   Supple, symmetrical, trachea midline, no adenopathy;       thyroid:  No enlargement/tenderness/nodules; no carotid   bruit or JVD  Back:     Symmetric, no curvature, ROM normal, no CVA tenderness  Lungs:     Clear to auscultation bilaterally, respirations unlabored  Chest wall:    No tenderness or deformity  Heart:    Regular rate and rhythm, S1 and S2 normal, no murmur, rub   or gallop  Abdomen:     Soft, non-tender, bowel sounds active all four quadrants,    no masses, no organomegaly  Genitalia:    Normal male without lesion,  discharge or tenderness  Rectal:    Normal tone, normal prostate, no masses or tenderness;   guaiac negative stool  Extremities:   Extremities normal, atraumatic, no cyanosis or edema  Pulses:   2+ and symmetric all extremities  Skin:   Skin color, texture, turgor normal, no rashes or lesions  Lymph nodes:   Cervical, supraclavicular, and axillary nodes normal  Neurologic:   CNII-XII intact. Normal strength, sensation and reflexes      throughout    Assessment and Plan:   Onychomycosis Vs dystrophic nails. Nail cultures were negative for fungus, and one  Month of terbinafine has not refulted in  any improvement.  Referral to podiatry for treatment   Routine general medical examination at a health care facility Annual male exam was done including testicular and prostate exam. PSA is pending .  Colon ca screening was reviewed and options given.   Lab Results  Component Value Date   PSA 0.70 04/18/2012      Updated Medication List Outpatient Encounter Prescriptions as of 12/29/2013  Medication Sig  . ALPRAZolam (XANAX) 0.5 MG tablet Take 0.25 mg by mouth 4 (four) times daily.  Marland Kitchen. amitriptyline (ELAVIL) 10 MG tablet Take 10 mg by mouth at bedtime.  . Eszopiclone (ESZOPICLONE) 3 MG TABS Take 3 mg by mouth at bedtime. Take immediately before bedtime  . glycopyrrolate (ROBINUL) 1 MG tablet Take 1 mg by mouth 2 (two) times daily.  Marland Kitchen. ketoconazole (NIZORAL) 2 % cream Apply 1 application topically daily.  . Multiple Vitamin (MULTIVITAMIN) tablet Take 1 tablet by mouth daily.  Marland Kitchen. tetrabenazine (XENAZINE) 25 MG tablet Take 12.5 mg by mouth 4 (four) times daily.   . trihexyphenidyl (ARTANE) 2 MG tablet Take 1 mg by mouth 4 (four) times daily.   . [DISCONTINUED] terbinafine (LAMISIL) 250 MG tablet Take 1 tablet (250 mg total) by mouth daily.  . sildenafil (VIAGRA) 50 MG tablet Take 1 tablet (50 mg total) by mouth daily as needed for erectile dysfunction.

## 2013-12-29 NOTE — Patient Instructions (Addendum)
   Stop the ketoconazole, since it is not heling and your toenail culture was negative for fungus   Referral to Dr Al CorpusHyatt, podiatry for management of nail dystrophy

## 2013-12-29 NOTE — Assessment & Plan Note (Signed)
Vs dystrophic nails. Nail cultures were negative for fungus, and one  Month of terbinafine has not refulted in any improvement.  Referral to podiatry for treatment

## 2013-12-29 NOTE — Progress Notes (Signed)
Pre-visit discussion using our clinic review tool. No additional management support is needed unless otherwise documented below in the visit note.  

## 2013-12-30 LAB — PSA, TOTAL AND FREE
PSA, Free Pct: 25 % (ref >25)
PSA, Free: 0.32 ng/mL
PSA: 1.27 ng/mL (ref <=4.00)

## 2013-12-30 LAB — VITAMIN D 25 HYDROXY (VIT D DEFICIENCY, FRACTURES): Vit D, 25-Hydroxy: 31 ng/mL (ref 30–89)

## 2014-01-01 NOTE — Addendum Note (Signed)
Addended by: Sherlene ShamsULLO, Elfreda Blanchet L on: 01/01/2014 07:01 AM   Modules accepted: Orders

## 2014-01-05 ENCOUNTER — Encounter: Payer: Self-pay | Admitting: Internal Medicine

## 2014-03-24 ENCOUNTER — Other Ambulatory Visit: Payer: Self-pay | Admitting: Internal Medicine

## 2014-07-07 ENCOUNTER — Other Ambulatory Visit: Payer: Self-pay | Admitting: Internal Medicine

## 2014-12-02 ENCOUNTER — Other Ambulatory Visit: Payer: Self-pay | Admitting: Internal Medicine

## 2015-01-28 ENCOUNTER — Other Ambulatory Visit: Payer: Self-pay | Admitting: Internal Medicine

## 2015-01-28 ENCOUNTER — Telehealth: Payer: Self-pay | Admitting: Internal Medicine

## 2015-01-28 DIAGNOSIS — Z125 Encounter for screening for malignant neoplasm of prostate: Secondary | ICD-10-CM

## 2015-01-28 DIAGNOSIS — Z79899 Other long term (current) drug therapy: Secondary | ICD-10-CM

## 2015-01-28 DIAGNOSIS — E785 Hyperlipidemia, unspecified: Secondary | ICD-10-CM

## 2015-01-28 NOTE — Telephone Encounter (Signed)
Last OV 4.27.15, last refill 11.3.15 please advise refill

## 2015-01-28 NOTE — Telephone Encounter (Signed)
Spoke with pt and his wife.  They are out of town, pts wife states she will call back on Tuesday to schedule appointments.

## 2015-01-28 NOTE — Telephone Encounter (Signed)
REFILL ON TERBINAFINE DENIED BECAUSE HE HAS NOT BEEN SEEN OR HAD LIVER TESTS IN OVER A YEAR. I WILL NOT REFILL UNTIL BOTH ISSUES ARE SATSIFIED.  LABS ORDERED, PLESAE HAE THEM DONE PRIOR TO OV

## 2015-02-02 ENCOUNTER — Other Ambulatory Visit: Payer: Self-pay | Admitting: Internal Medicine

## 2015-02-02 NOTE — Telephone Encounter (Signed)
Last Ov 4.13.15.  Next OV 6.3.16, labs scheduled 6.1.16.  Please advise refill

## 2015-02-03 ENCOUNTER — Other Ambulatory Visit (INDEPENDENT_AMBULATORY_CARE_PROVIDER_SITE_OTHER): Payer: Federal, State, Local not specified - PPO

## 2015-02-03 DIAGNOSIS — Z125 Encounter for screening for malignant neoplasm of prostate: Secondary | ICD-10-CM | POA: Diagnosis not present

## 2015-02-03 DIAGNOSIS — E785 Hyperlipidemia, unspecified: Secondary | ICD-10-CM

## 2015-02-03 DIAGNOSIS — Z79899 Other long term (current) drug therapy: Secondary | ICD-10-CM | POA: Diagnosis not present

## 2015-02-03 NOTE — Telephone Encounter (Signed)
Not until labs are seen and exam done

## 2015-02-04 ENCOUNTER — Encounter: Payer: Self-pay | Admitting: *Deleted

## 2015-02-04 ENCOUNTER — Other Ambulatory Visit: Payer: Self-pay | Admitting: *Deleted

## 2015-02-04 LAB — LIPID PANEL
Cholesterol: 194 mg/dL (ref 0–200)
HDL: 65.3 mg/dL (ref >39.00)
LDL CALC: 121 mg/dL — AB (ref 0–99)
NonHDL: 128.7
Total CHOL/HDL Ratio: 3
Triglycerides: 38 mg/dL (ref 0.0–149.0)
VLDL: 7.6 mg/dL (ref 0.0–40.0)

## 2015-02-04 LAB — COMPREHENSIVE METABOLIC PANEL
ALK PHOS: 40 U/L (ref 39–117)
ALT: 24 U/L (ref 0–53)
AST: 22 U/L (ref 0–37)
Albumin: 4.4 g/dL (ref 3.5–5.2)
BUN: 13 mg/dL (ref 6–23)
CO2: 30 mEq/L (ref 19–32)
CREATININE: 0.92 mg/dL (ref 0.40–1.50)
Calcium: 9.5 mg/dL (ref 8.4–10.5)
Chloride: 102 mEq/L (ref 96–112)
GFR: 108.32 mL/min (ref >60.00)
Glucose, Bld: 95 mg/dL (ref 70–99)
Potassium: 3.9 mEq/L (ref 3.5–5.1)
SODIUM: 137 meq/L (ref 135–145)
Total Bilirubin: 0.5 mg/dL (ref 0.2–1.2)
Total Protein: 7.2 g/dL (ref 6.0–8.3)

## 2015-02-04 LAB — PSA: PSA: 1.19 ng/mL (ref 0.10–4.00)

## 2015-02-04 MED ORDER — TERBINAFINE HCL 250 MG PO TABS
ORAL_TABLET | ORAL | Status: DC
Start: 2015-02-04 — End: 2015-02-05

## 2015-02-04 NOTE — Progress Notes (Signed)
Refill sent as requested and advised by MD

## 2015-02-05 ENCOUNTER — Ambulatory Visit (INDEPENDENT_AMBULATORY_CARE_PROVIDER_SITE_OTHER): Payer: Federal, State, Local not specified - PPO | Admitting: Internal Medicine

## 2015-02-05 ENCOUNTER — Encounter: Payer: Self-pay | Admitting: Internal Medicine

## 2015-02-05 VITALS — HR 73 | Temp 98.4°F | Resp 14 | Wt 158.0 lb

## 2015-02-05 DIAGNOSIS — N529 Male erectile dysfunction, unspecified: Secondary | ICD-10-CM | POA: Diagnosis not present

## 2015-02-05 DIAGNOSIS — Z0000 Encounter for general adult medical examination without abnormal findings: Secondary | ICD-10-CM

## 2015-02-05 DIAGNOSIS — N522 Drug-induced erectile dysfunction: Secondary | ICD-10-CM

## 2015-02-05 DIAGNOSIS — B351 Tinea unguium: Secondary | ICD-10-CM | POA: Diagnosis not present

## 2015-02-05 DIAGNOSIS — R4189 Other symptoms and signs involving cognitive functions and awareness: Secondary | ICD-10-CM

## 2015-02-05 DIAGNOSIS — R634 Abnormal weight loss: Secondary | ICD-10-CM

## 2015-02-05 MED ORDER — TADALAFIL 20 MG PO TABS: 10.0000 mg | ORAL_TABLET | ORAL | Status: DC | PRN

## 2015-02-05 NOTE — Patient Instructions (Addendum)
I do want you to lose any more weight .   YOU WILL START LOSING MUSCLE TONE  You need to eat 3 times daily MINIMUM  Please try  the Premier Protein shakes ,  Or the Muscle Milk drinks.  Drink 2 for lunch if you can   Available at [employer] and [employer]   Please consider resuming Megace,  And consider outpatient PT for balance   Health Maintenance A healthy lifestyle and preventative care can promote health and wellness.  Maintain regular health, dental, and eye exams.  Eat a healthy diet. Foods like vegetables, fruits, whole grains, low-fat dairy products, and lean protein foods contain the nutrients you need and are low in calories. Decrease your intake of foods high in solid fats, added sugars, and salt. Get information about a proper diet from your health care provider, if necessary.  Regular physical exercise is one of the most important things you can do for your health. Most adults should get at least 150 minutes of moderate-intensity exercise (any activity that increases your heart rate and causes you to sweat) each week. In addition, most adults need muscle-strengthening exercises on 2 or more days a week.   Maintain a healthy weight. The body mass index (BMI) is a screening tool to identify possible weight problems. It provides an estimate of body fat based on height and weight. Your health care provider can find your BMI and can help you achieve or maintain a healthy weight. For males 20 years and older:  A BMI below 18.5 is considered underweight.  A BMI of 18.5 to 24.9 is normal.  A BMI of 25 to 29.9 is considered overweight.  A BMI of 30 and above is considered obese.  Maintain normal blood lipids and cholesterol by exercising and minimizing your intake of saturated fat. Eat a balanced diet with plenty of fruits and vegetables. Blood tests for lipids and cholesterol should begin at age 59 and be repeated every 5 years. If your lipid or cholesterol levels are high, you are over age  59, or you are at high risk for heart disease, you may need your cholesterol levels checked more frequently.Ongoing high lipid and cholesterol levels should be treated with medicines if diet and exercise are not working.  If you smoke, find out from your health care provider how to quit. If you do not use tobacco, do not start.  Lung cancer screening is recommended for adults aged 55-80 years who are at high risk for developing lung cancer because of a history of smoking. A yearly low-dose CT scan of the lungs is recommended for people who have at least a 30-pack-year history of smoking and are current smokers or have quit within the past 15 years. A pack year of smoking is smoking an average of 1 pack of cigarettes a day for 1 year (for example, a 30-pack-year history of smoking could mean smoking 1 pack a day for 30 years or 2 packs a day for 15 years). Yearly screening should continue until the smoker has stopped smoking for at least 15 years. Yearly screening should be stopped for people who develop a health problem that would prevent them from having lung cancer treatment.  If you choose to drink alcohol, do not have more than 2 drinks per day. One drink is considered to be 12 oz (360 mL) of beer, 5 oz (150 mL) of wine, or 1.5 oz (45 mL) of liquor.  Avoid the use of street drugs. Do not  share needles with anyone. Ask for help if you need support or instructions about stopping the use of drugs.  High blood pressure causes heart disease and increases the risk of stroke. Blood pressure should be checked at least every 1-2 years. Ongoing high blood pressure should be treated with medicines if weight loss and exercise are not effective.  If you are 30-48 years old, ask your health care provider if you should take aspirin to prevent heart disease.  Diabetes screening involves taking a blood sample to check your fasting blood sugar level. This should be done once every 3 years after age 12 if you are at  a normal weight and without risk factors for diabetes. Testing should be considered at a younger age or be carried out more frequently if you are overweight and have at least 1 risk factor for diabetes.  Colorectal cancer can be detected and often prevented. Most routine colorectal cancer screening begins at the age of 20 and continues through age 64. However, your health care provider may recommend screening at an earlier age if you have risk factors for colon cancer. On a yearly basis, your health care provider may provide home test kits to check for hidden blood in the stool. A small camera at the end of a tube may be used to directly examine the colon (sigmoidoscopy or colonoscopy) to detect the earliest forms of colorectal cancer. Talk to your health care provider about this at age 47 when routine screening begins. A direct exam of the colon should be repeated every 5-10 years through age 34, unless early forms of precancerous polyps or small growths are found.  People who are at an increased risk for hepatitis B should be screened for this virus. You are considered at high risk for hepatitis B if:  You were born in a country where hepatitis B occurs often. Talk with your health care provider about which countries are considered high risk.  Your parents were born in a high-risk country and you have not received a shot to protect against hepatitis B (hepatitis B vaccine).  You have HIV or AIDS.  You use needles to inject street drugs.  You live with, or have sex with, someone who has hepatitis B.  You are a man who has sex with other men (MSM).  You get hemodialysis treatment.  You take certain medicines for conditions like cancer, organ transplantation, and autoimmune conditions.  Hepatitis C blood testing is recommended for all people born from [birth year] through 1965 and any individual with known risk factors for hepatitis C.  Healthy men should no longer receive prostate-specific antigen  (PSA) blood tests as part of routine cancer screening. Talk to your health care provider about prostate cancer screening.  Testicular cancer screening is not recommended for adolescents or adult males who have no symptoms. Screening includes self-exam, a health care provider exam, and other screening tests. Consult with your health care provider about any symptoms you have or any concerns you have about testicular cancer.  Practice safe sex. Use condoms and avoid high-risk sexual practices to reduce the spread of sexually transmitted infections (STIs).  You should be screened for STIs, including gonorrhea and chlamydia if:  You are sexually active and are younger than 24 years.  You are older than 24 years, and your health care provider tells you that you are at risk for this type of infection.  Your sexual activity has changed since you were last screened, and you are at an  increased risk for chlamydia or gonorrhea. Ask your health care provider if you are at risk.  If you are at risk of being infected with HIV, it is recommended that you take a prescription medicine daily to prevent HIV infection. This is called pre-exposure prophylaxis (PrEP). You are considered at risk if:  You are a man who has sex with other men (MSM).  You are a heterosexual man who is sexually active with multiple partners.  You take drugs by injection.  You are sexually active with a partner who has HIV.  Talk with your health care provider about whether you are at high risk of being infected with HIV. If you choose to begin PrEP, you should first be tested for HIV. You should then be tested every 3 months for as long as you are taking PrEP.  Use sunscreen. Apply sunscreen liberally and repeatedly throughout the day. You should seek shade when your shadow is shorter than you. Protect yourself by wearing long sleeves, pants, a wide-brimmed hat, and sunglasses year round whenever you are outdoors.  Tell your health  care provider of new moles or changes in moles, especially if there is a change in shape or color. Also, tell your health care provider if a mole is larger than the size of a pencil eraser.  A one-time screening for abdominal aortic aneurysm (AAA) and surgical repair of large AAAs by ultrasound is recommended for men aged 65-75 years who are current or former smokers.  Stay current with your vaccines (immunizations). Document Released: 02/17/2008 Document Revised: 08/26/2013 Document Reviewed: 01/16/2011 [facility] Patient Information 2015 [place], [place]. This information is not intended to replace advice given to you by your health care provider. Make sure you discuss any questions you have with your health care provider.

## 2015-02-05 NOTE — Progress Notes (Signed)
Pre visit review using our clinic review tool, if applicable. No additional management support is needed unless otherwise documented below in the visit note. 

## 2015-02-07 DIAGNOSIS — R634 Abnormal weight loss: Secondary | ICD-10-CM | POA: Insufficient documentation

## 2015-02-07 NOTE — Progress Notes (Signed)
Patient ID: DOSS CYBULSKI, male    DOB: Feb 01, 1956  Age: 59 y.o. MRN: [id number]  The patient is here for annual  wellness examination and management of other chronic and acute problems.   The risk factors are reflected in the social history.  The roster of all physicians providing medical care to patient - is listed in the Snapshot section of the chart.  Activities of daily living:  The patient is 100% independent in all ADLs: dressing, toileting, feeding as well as independent mobility  Home safety : The patient has smoke detectors in the home. They wear seatbelts.  There are no firearms at home. There is no violence in the home.   There is no risks for hepatitis, STDs or HIV. There is no   history of blood transfusion. They have no travel history to infectious disease endemic areas of the world.  The patient has seen their dentist in the last six month. They have seen their eye doctor in the last year. They admit to slight hearing difficulty with regard to whispered voices and some television programs.  They have deferred audiologic testing in the last year.  They do not  have excessive sun exposure. Discussed the need for sun protection: hats, long sleeves and use of sunscreen if there is significant sun exposure.   Diet: the importance of a healthy diet is discussed. They do have a healthy diet.  The benefits of regular aerobic exercise were discussed. He does not exercise because his dsyphonia results in respiratory difficulties.   Depression screen: there are no signs or vegative symptoms of depression- irritability, change in appetite, anhedonia, sadness/tearfullness.  Cognitive assessment: the patient manages all their financial and personal affairs and is actively engaged. They could relate day,date,year and events; recalled 2/3 objects at 3 minutes; performed clock-face test normally.  The following portions of the patient's history were reviewed and updated as appropriate:  allergies, current medications, past family history, past medical history,  past surgical history, past social history  and problem list.  Visual acuity was not assessed per patient preference since she has regular follow up with her ophthalmologist. Hearing and body mass index were assessed and reviewed.   During the course of the visit the patient was educated and counseled about appropriate screening and preventive services including : fall prevention , diabetes screening, nutrition counseling, colorectal cancer screening, and recommended immunizations.    CC: The primary encounter diagnosis was Onychomycosis. Diagnoses of Routine general medical examination at a health care facility, Cognitive decline, Drug-induced impotence, and Loss of weight were also pertinent to this visit.  History Aniceto has a past medical history of Dysphonia.   He has past surgical history that includes Tonsillectomy.   His family history includes Cancer (age of onset: 71) in his brother; Diabetes in his father and mother; Hypertension in his father and mother; Stroke in his father.He reports that he has never smoked. He has never used smokeless tobacco. He reports that he drinks about 0.5 oz of alcohol per week. He reports that he does not use illicit drugs.  Outpatient Prescriptions Prior to Visit  Medication Sig Dispense Refill  . ALPRAZolam (XANAX) 0.5 MG tablet Take 0.25 mg by mouth 4 (four) times daily.    . Eszopiclone (ESZOPICLONE) 3 MG TABS Take 3 mg by mouth at bedtime. Take immediately before bedtime    . ketoconazole (NIZORAL) 2 % cream Apply 1 application topically daily.    . Multiple Vitamin (MULTIVITAMIN) tablet Take 1  tablet by mouth daily.    Marland Kitchen tetrabenazine (XENAZINE) 25 MG tablet Take 12.5 mg by mouth 4 (four) times daily.     . trihexyphenidyl (ARTANE) 2 MG tablet Take 1 mg by mouth 4 (four) times daily.     Marland Kitchen terbinafine (LAMISIL) 250 MG tablet TAKE 1 TABLET (250 MG TOTAL) BY MOUTH DAILY.  (Patient not taking: Reported on 02/05/2015) 30 tablet 2  . amitriptyline (ELAVIL) 10 MG tablet Take 10 mg by mouth at bedtime.    Marland Kitchen glycopyrrolate (ROBINUL) 1 MG tablet Take 1 mg by mouth 2 (two) times daily.    . sildenafil (VIAGRA) 50 MG tablet Take 1 tablet (50 mg total) by mouth daily as needed for erectile dysfunction. 3 tablet 0  . terbinafine (LAMISIL) 250 MG tablet TAKE 1 TABLET (250 MG TOTAL) BY MOUTH DAILY. 30 tablet 2   No facility-administered medications prior to visit.    Review of Systems   Patient denies headache, fevers, malaise, unintentional weight loss, skin rash, eye pain, sinus congestion and sinus pain, sore throat, dysphagia,  hemoptysis , cough, dyspnea, wheezing, chest pain, palpitations, orthopnea, edema, abdominal pain, nausea, melena, diarrhea, constipation, flank pain, dysuria, hematuria, urinary  Frequency, nocturia, numbness, tingling, seizures,  Focal weakness, Loss of consciousness,  Tremor, insomnia, depression, anxiety, and suicidal ideation.      Objective:  Pulse 73  Temp(Src) 98.4 F (36.9 C) (Oral)  Resp 14  Wt 158 lb (71.668 kg)  SpO2 98%  Physical Exam Pulse 73  Temp(Src) 98.4 F (36.9 C) (Oral)  Resp 14  Wt 158 lb (71.668 kg)  SpO2 98%  General Appearance:    Alert, cooperative, no distress, appears stated age  Head:    Normocephalic, without obvious abnormality, atraumatic  Eyes:    PERRL, conjunctiva/corneas clear, EOM's intact, fundi    benign, both eyes       Ears:    Normal TM's and external ear canals, both ears  Nose:   Nares normal, septum midline, mucosa normal, no drainage   or sinus tenderness  Throat:   Lips, mucosa, and tongue normal; teeth and gums normal  Neck:   Supple, symmetrical, trachea midline, no adenopathy;       thyroid:  No enlargement/tenderness/nodules; no carotid   bruit or JVD  Back:     Symmetric, no curvature, ROM normal, no CVA tenderness  Lungs:     Clear to auscultation bilaterally, respirations  unlabored  Chest wall:    No tenderness or deformity  Heart:    Regular rate and rhythm, S1 and S2 normal, no murmur, rub   or gallop  Abdomen:     Soft, non-tender, bowel sounds active all four quadrants,    no masses, no organomegaly  Genitalia:    Normal male without lesion, discharge or tenderness  Rectal:    Normal tone, normal prostate, no masses or tenderness;   guaiac negative stool  Extremities:   Extremities normal, atraumatic, no cyanosis or edema  Pulses:   2+ and symmetric all extremities  Skin:   Skin color, texture, turgor normal, no rashes or lesions  Lymph nodes:   Cervical, supraclavicular, and axillary nodes normal  Neurologic:   CNII-XII intact. Normal strength, sensation and reflexes      throughout    Assessment & Plan:   Problem List Items Addressed This Visit    Routine general medical examination at a health care facility    Annual wellness  exam was done as well as a  comprehensive physical exam  .  During the course of the visit the patient was educated and counseled about appropriate screening and preventive services and screenings were brought up to date for pid analysis with projected  10 year  risk for CAD. nutrition counseling, skin cancer screening has been recommended, along with review of the age appropriate recommended immunizations.  Printed recommendations for health maintenance screenings was given.        Drug-induced impotence    cialis rx given.       Onychomycosis - Primary    Persistent for the past year. Will  resume terbinafine,  Liver enzymes are normal      Relevant Medications   Efinaconazole 10 % SOLN   naftifine (NAFTIN) 1 % cream   Cognitive decline    He has had some loss of short term memory, and his wife is present today to discuss goals of care      Loss of weight     I have reviewed his diet and recommended that he increase her protein and fat intake while monitoring her carbohydrates.          I have discontinued  Mr. Jinny SandersDavis's amitriptyline, sildenafil, and glycopyrrolate. I am also having him start on tadalafil. Additionally, I am having him maintain his multivitamin, trihexyphenidyl, tetrabenazine, Eszopiclone, ALPRAZolam, ketoconazole, terbinafine, Efinaconazole, and naftifine.  Meds ordered this encounter  Medications  . Efinaconazole 10 % SOLN    Sig: Apply at bedtime-not using with Karasal  . naftifine (NAFTIN) 1 % cream    Sig: Apply 1 application topically daily. X 2 weeks  . tadalafil (CIALIS) 20 MG tablet    Sig: Take 0.5-1 tablets (10-20 mg total) by mouth every other day as needed for erectile dysfunction.    Dispense:  5 tablet    Refill:  11    Medications Discontinued During This Encounter  Medication Reason  . terbinafine (LAMISIL) 250 MG tablet Duplicate  . amitriptyline (ELAVIL) 10 MG tablet Change in therapy  . glycopyrrolate (ROBINUL) 1 MG tablet Change in therapy  . sildenafil (VIAGRA) 50 MG tablet Change in therapy    Follow-up: No Follow-up on file.   Sherlene ShamsULLO, Karissa Meenan L, MD

## 2015-02-07 NOTE — Assessment & Plan Note (Signed)
He has had some loss of short term memory, and his wife is present today to discuss goals of care

## 2015-02-07 NOTE — Assessment & Plan Note (Signed)
I have reviewed his diet and recommended that he increase her protein and fat intake while monitoring her carbohydrates.

## 2015-02-07 NOTE — Assessment & Plan Note (Addendum)
cialis rx given.

## 2015-02-07 NOTE — Assessment & Plan Note (Signed)
Annual wellness  exam was done as well as a comprehensive physical exam  .  During the course of the visit the patient was educated and counseled about appropriate screening and preventive services and screenings were brought up to date for pid analysis with projected  10 year  risk for CAD. nutrition counseling, skin cancer screening has been recommended, along with review of the age appropriate recommended immunizations.  Printed recommendations for health maintenance screenings was given.

## 2015-02-07 NOTE — Assessment & Plan Note (Signed)
Persistent for the past year. Will  resume terbinafine,  Liver enzymes are normal

## 2015-02-09 ENCOUNTER — Other Ambulatory Visit: Payer: Self-pay | Admitting: Internal Medicine

## 2015-02-09 MED ORDER — TERBINAFINE HCL 250 MG PO TABS: ORAL_TABLET | ORAL | Status: DC

## 2015-04-25 ENCOUNTER — Other Ambulatory Visit: Payer: Self-pay | Admitting: Internal Medicine

## 2015-12-07 ENCOUNTER — Encounter: Payer: Self-pay | Admitting: Internal Medicine

## 2015-12-07 ENCOUNTER — Ambulatory Visit (INDEPENDENT_AMBULATORY_CARE_PROVIDER_SITE_OTHER): Payer: Federal, State, Local not specified - PPO | Admitting: Internal Medicine

## 2015-12-07 VITALS — BP 92/60 | HR 93 | Temp 98.3°F | Resp 12 | Ht 69.0 in | Wt 161.5 lb

## 2015-12-07 DIAGNOSIS — R635 Abnormal weight gain: Secondary | ICD-10-CM

## 2015-12-07 DIAGNOSIS — R5383 Other fatigue: Secondary | ICD-10-CM

## 2015-12-07 DIAGNOSIS — N529 Male erectile dysfunction, unspecified: Secondary | ICD-10-CM

## 2015-12-07 DIAGNOSIS — N522 Drug-induced erectile dysfunction: Secondary | ICD-10-CM

## 2015-12-07 DIAGNOSIS — Z7289 Other problems related to lifestyle: Secondary | ICD-10-CM

## 2015-12-07 DIAGNOSIS — R634 Abnormal weight loss: Secondary | ICD-10-CM

## 2015-12-07 DIAGNOSIS — T50905A Adverse effect of unspecified drugs, medicaments and biological substances, initial encounter: Secondary | ICD-10-CM

## 2015-12-07 DIAGNOSIS — J383 Other diseases of vocal cords: Secondary | ICD-10-CM

## 2015-12-07 NOTE — Progress Notes (Signed)
Subjective:  Patient ID: Nicholas Singleton, male    DOB: 1955-10-20  Age: 60 y.o. MRN: [id number]  CC: The primary encounter diagnosis was Other fatigue. Diagnoses of Loss of weight, Other problems related to lifestyle, Drug-induced impotence, Weight gain due to medication, and Dysphonia spastica were also pertinent to this visit.  HPI Nicholas Singleton presents for evaluation of erectile dysfunction  Caused by several  his medications .  Was prescrbed Cialis last June.   Wants to see Gaynelle Arabian for consider ation of the penile pump.   Using 0.25 g xanax 4 times a day to manage dthe feeling og throat closing up,  Works for about 3 hours.  eacj dpse.  Wants to take more.e  Will discuss with Dr Nicki Reaper    Outpatient Prescriptions Prior to Visit  Medication Sig Dispense Refill  . ALPRAZolam (XANAX) 0.5 MG tablet Take 0.25 mg by mouth 4 (four) times daily.    . Eszopiclone (ESZOPICLONE) 3 MG TABS Take 3 mg by mouth at bedtime. Take immediately before bedtime    . Multiple Vitamin (MULTIVITAMIN) tablet Take 1 tablet by mouth daily.    . tadalafil (CIALIS) 20 MG tablet Take 0.5-1 tablets (10-20 mg total) by mouth every other day as needed for erectile dysfunction. 5 tablet 11  . tetrabenazine (XENAZINE) 25 MG tablet Take 12.5 mg by mouth 4 (four) times daily.     . trihexyphenidyl (ARTANE) 2 MG tablet Take 1 mg by mouth 4 (four) times daily.     Marland Kitchen terbinafine (LAMISIL) 250 MG tablet TAKE 1 TABLET BY MOUTH DAILY. 30 tablet 2  . Efinaconazole 10 % SOLN Reported on 12/07/2015    . ketoconazole (NIZORAL) 2 % cream Apply 1 application topically daily. Reported on 12/07/2015    . naftifine (NAFTIN) 1 % cream Apply 1 application topically daily. Reported on 12/07/2015    . terbinafine (LAMISIL) 250 MG tablet TAKE 1 TABLET (250 MG TOTAL) BY MOUTH DAILY. (Patient not taking: Reported on 12/07/2015) 30 tablet 2   No facility-administered medications prior to visit.    Review of Systems;  Patient denies headache,  fevers, malaise, unintentional weight loss, skin rash, eye pain, sinus congestion and sinus pain, sore throat, dysphagia,  hemoptysis , cough, dyspnea, wheezing, chest pain, palpitations, orthopnea, edema, abdominal pain, nausea, melena, diarrhea, constipation, flank pain, dysuria, hematuria, urinary  Frequency, nocturia, numbness, tingling, seizures,  Focal weakness, Loss of consciousness,  Tremor, insomnia, depression, anxiety, and suicidal ideation.      Objective:  BP 92/60 mmHg  Pulse 93  Temp(Src) 98.3 F (36.8 C) (Oral)  Resp 12  Ht _0  (1.753 m)  Wt 161 lb 8 oz (73.256 kg)  BMI 23.84 kg/m2  SpO2 97%  BP Readings from Last 3 Encounters:  12/07/15 92/60  12/29/13 112/72  11/19/13 108/76    Wt Readings from Last 3 Encounters:  12/07/15 161 lb 8 oz (73.256 kg)  02/05/15 158 lb (71.668 kg)  12/29/13 165 lb 8 oz (75.07 kg)    General appearance: alert, cooperative and appears stated age Ears: normal TM's and external ear canals both ears Throat: lips, mucosa, and tongue normal; teeth and gums normal Neck: no adenopathy, no carotid bruit, supple, symmetrical, trachea midline and thyroid not enlarged, symmetric, no tenderness/mass/nodules Back: symmetric, no curvature. ROM normal. No CVA tenderness. Lungs: clear to auscultation bilaterally Heart: regular rate and rhythm, S1, S2 normal, no murmur, click, rub or gallop Abdomen: soft, non-tender; bowel sounds normal; no masses,  no  organomegaly Pulses: 2+ and symmetric Skin: Skin color, texture, turgor normal. No rashes or lesions Lymph nodes: Cervical, supraclavicular, and axillary nodes normal.  No results found for: HGBA1C  Lab Results  Component Value Date   CREATININE 1.03 12/07/2015   CREATININE 0.92 02/03/2015   CREATININE 1.2 12/29/2013    Lab Results  Component Value Date   WBC 5.2 12/07/2015   HGB 13.6 12/07/2015   HCT 41.2 12/07/2015   PLT 184.0 12/07/2015   GLUCOSE 102* 12/07/2015   CHOL 194  02/03/2015   TRIG 38.0 02/03/2015   HDL 65.30 02/03/2015   LDLDIRECT 155.2 12/29/2013   LDLCALC 121* 02/03/2015   ALT 26 12/07/2015   AST 30 12/07/2015   NA 137 12/07/2015   K 4.2 12/07/2015   CL 101 12/07/2015   CREATININE 1.03 12/07/2015   BUN 22 12/07/2015   CO2 30 12/07/2015   TSH 0.78 12/07/2015   PSA 1.19 02/03/2015    No results found.  Assessment & Plan:   Problem List Items Addressed This Visit    Drug-induced impotence    He has been unable to sustain an erection with cialis for more than 5 minutes.  Discussed referral to Urology for consideration of  Other therapies      Relevant Orders   Ambulatory referral to Urology   Weight gain due to medication    Reassured that his weight is normal      Dysphonia spastica    He remains dependent on alprazolam 4 times daily to manage the feeling of constriction around his vocal cords,       Loss of weight   Relevant Orders   Prealbumin    Other Visit Diagnoses    Other fatigue    -  Primary    Relevant Orders    Comp Met (CMET) (Completed)    TSH (Completed)    CBC with Differential/Platelet (Completed)    Other problems related to lifestyle        Relevant Orders    Hepatitis C antibody    HIV antibody (with reflex)      A total of 25 minutes of face to face time was spent with patient more than half of which was spent in counselling about the above mentioned conditions  and coordination of care  I have discontinued Mr. Groot terbinafine and terbinafine. I am also having him maintain his multivitamin, trihexyphenidyl, tetrabenazine, Eszopiclone, ALPRAZolam, ketoconazole, Efinaconazole, naftifine, tadalafil, and amitriptyline.  Meds ordered this encounter  Medications  . amitriptyline (ELAVIL) 10 MG tablet    Sig: Take 20 mg by mouth at bedtime.     Medications Discontinued During This Encounter  Medication Reason  . terbinafine (LAMISIL) 250 MG tablet   . terbinafine (LAMISIL) 250 MG tablet      Follow-up: Return in about 6 months (around 06/07/2016) for annual exam .   Anelis Hrivnak, Aris Everts, MD

## 2015-12-07 NOTE — Progress Notes (Signed)
Pre-visit discussion using our clinic review tool. No additional management support is needed unless otherwise documented below in the visit note.  

## 2015-12-08 LAB — CBC WITH DIFFERENTIAL/PLATELET
BASOS ABS: 0 10*3/uL (ref 0.0–0.1)
Basophils Relative: 0.8 % (ref 0.0–3.0)
EOS ABS: 0.1 10*3/uL (ref 0.0–0.7)
Eosinophils Relative: 2.4 % (ref 0.0–5.0)
HEMATOCRIT: 41.2 % (ref 39.0–52.0)
Hemoglobin: 13.6 g/dL (ref 13.0–17.0)
LYMPHS ABS: 1.4 10*3/uL (ref 0.7–4.0)
LYMPHS PCT: 26.9 % (ref 12.0–46.0)
MCHC: 33 g/dL (ref 30.0–36.0)
MCV: 86.5 fl (ref 78.0–100.0)
MONOS PCT: 8.4 % (ref 3.0–12.0)
Monocytes Absolute: 0.4 10*3/uL (ref 0.1–1.0)
NEUTROS ABS: 3.2 10*3/uL (ref 1.4–7.7)
NEUTROS PCT: 61.5 % (ref 43.0–77.0)
PLATELETS: 184 10*3/uL (ref 150.0–400.0)
RBC: 4.77 Mil/uL (ref 4.22–5.81)
RDW: 14.1 % (ref 11.5–15.5)
WBC: 5.2 10*3/uL (ref 4.0–10.5)

## 2015-12-08 LAB — COMPREHENSIVE METABOLIC PANEL
ALBUMIN: 4.2 g/dL (ref 3.5–5.2)
ALK PHOS: 42 U/L (ref 39–117)
ALT: 26 U/L (ref 0–53)
AST: 30 U/L (ref 0–37)
BILIRUBIN TOTAL: 0.4 mg/dL (ref 0.2–1.2)
BUN: 22 mg/dL (ref 6–23)
CALCIUM: 9.8 mg/dL (ref 8.4–10.5)
CO2: 30 mEq/L (ref 19–32)
Chloride: 101 mEq/L (ref 96–112)
Creatinine, Ser: 1.03 mg/dL (ref 0.40–1.50)
GFR: 94.81 mL/min (ref >60.00)
GLUCOSE: 102 mg/dL — AB (ref 70–99)
POTASSIUM: 4.2 meq/L (ref 3.5–5.1)
Sodium: 137 mEq/L (ref 135–145)
TOTAL PROTEIN: 7.5 g/dL (ref 6.0–8.3)

## 2015-12-08 LAB — HEPATITIS C ANTIBODY: HCV Ab: NEGATIVE

## 2015-12-08 LAB — TSH: TSH: 0.78 u[IU]/mL (ref 0.35–4.50)

## 2015-12-08 NOTE — Assessment & Plan Note (Signed)
Reassured that his weight is normal

## 2015-12-08 NOTE — Assessment & Plan Note (Addendum)
He has been unable to sustain an erection with cialis for more than 5 minutes.  Discussed referral to Urology for consideration of  Other therapies

## 2015-12-08 NOTE — Assessment & Plan Note (Signed)
He remains dependent on alprazolam 4 times daily to manage the feeling of constriction around his vocal cords,

## 2015-12-09 LAB — PREALBUMIN: PREALBUMIN: 27 mg/dL (ref 21–43)

## 2015-12-09 LAB — HIV ANTIBODY (ROUTINE TESTING W REFLEX): HIV: NONREACTIVE

## 2015-12-10 ENCOUNTER — Encounter: Payer: Self-pay | Admitting: *Deleted

## 2015-12-15 ENCOUNTER — Encounter: Payer: Self-pay | Admitting: Internal Medicine

## 2015-12-29 ENCOUNTER — Telehealth: Payer: Self-pay | Admitting: Internal Medicine

## 2015-12-29 NOTE — Telephone Encounter (Signed)
This is the code that Erie NoeVanessa told us to use for Hepatitis C and HIV screening for Medicare patients. If there is an alternative code I do not know of it.  Try Z11.3

## 2015-12-29 NOTE — Telephone Encounter (Addendum)
[employer] called to state that the labs that were sent to [facility] for service date 12/07/15 has been denied. The diagnosis code used was Z72.89 and CPT codes [id number] and [phone]. The diagnosis code needs to be changed and resubmitted.

## 2015-12-29 NOTE — Telephone Encounter (Signed)
Please advise of new DX code.

## 2016-01-03 NOTE — Telephone Encounter (Signed)
Change of diagnoses code information has been forwarded to charge correction to resubmit under new code.

## 2016-01-03 NOTE — Telephone Encounter (Signed)
How do I resubmit a DX code?

## 2016-09-08 ENCOUNTER — Encounter: Payer: Self-pay | Admitting: Internal Medicine

## 2016-09-22 ENCOUNTER — Ambulatory Visit: Payer: Self-pay | Admitting: Internal Medicine

## 2016-10-25 ENCOUNTER — Ambulatory Visit (INDEPENDENT_AMBULATORY_CARE_PROVIDER_SITE_OTHER): Payer: Federal, State, Local not specified - PPO | Admitting: Internal Medicine

## 2016-10-25 ENCOUNTER — Ambulatory Visit (INDEPENDENT_AMBULATORY_CARE_PROVIDER_SITE_OTHER): Payer: Federal, State, Local not specified - PPO

## 2016-10-25 ENCOUNTER — Encounter: Payer: Self-pay | Admitting: Internal Medicine

## 2016-10-25 ENCOUNTER — Ambulatory Visit: Payer: Federal, State, Local not specified - PPO

## 2016-10-25 VITALS — BP 104/70 | HR 92 | Resp 16 | Wt 170.0 lb

## 2016-10-25 DIAGNOSIS — E7800 Pure hypercholesterolemia, unspecified: Secondary | ICD-10-CM

## 2016-10-25 DIAGNOSIS — N522 Drug-induced erectile dysfunction: Secondary | ICD-10-CM

## 2016-10-25 DIAGNOSIS — M25551 Pain in right hip: Secondary | ICD-10-CM

## 2016-10-25 DIAGNOSIS — R635 Abnormal weight gain: Secondary | ICD-10-CM | POA: Diagnosis not present

## 2016-10-25 DIAGNOSIS — R634 Abnormal weight loss: Secondary | ICD-10-CM

## 2016-10-25 LAB — COMPREHENSIVE METABOLIC PANEL
ALBUMIN: 4.2 g/dL (ref 3.5–5.2)
ALK PHOS: 45 U/L (ref 39–117)
ALT: 30 U/L (ref 0–53)
AST: 35 U/L (ref 0–37)
BUN: 21 mg/dL (ref 6–23)
CO2: 32 mEq/L (ref 19–32)
CREATININE: 1.08 mg/dL (ref 0.40–1.50)
Calcium: 9.5 mg/dL (ref 8.4–10.5)
Chloride: 102 mEq/L (ref 96–112)
GFR: 89.5 mL/min (ref >60.00)
GLUCOSE: 90 mg/dL (ref 70–99)
Potassium: 4.1 mEq/L (ref 3.5–5.1)
SODIUM: 138 meq/L (ref 135–145)
TOTAL PROTEIN: 7.3 g/dL (ref 6.0–8.3)
Total Bilirubin: 0.4 mg/dL (ref 0.2–1.2)

## 2016-10-25 LAB — LIPID PANEL
CHOL/HDL RATIO: 3
Cholesterol: 193 mg/dL (ref 0–200)
HDL: 58.9 mg/dL (ref >39.00)
LDL Cholesterol: 124 mg/dL — ABNORMAL HIGH (ref 0–99)
NONHDL: 133.77
Triglycerides: 47 mg/dL (ref 0.0–149.0)
VLDL: 9.4 mg/dL (ref 0.0–40.0)

## 2016-10-25 LAB — TSH: TSH: 0.73 u[IU]/mL (ref 0.35–4.50)

## 2016-10-25 MED ORDER — VARDENAFIL HCL 10 MG PO TABS: 10.0000 mg | ORAL_TABLET | Freq: Every day | ORAL | 0 refills | Status: DC | PRN

## 2016-10-25 MED ORDER — TADALAFIL 20 MG PO TABS: 10.0000 mg | ORAL_TABLET | ORAL | 11 refills | Status: DC | PRN

## 2016-10-25 NOTE — Progress Notes (Signed)
Pre visit review using our clinic review tool, if applicable. No additional management support is needed unless otherwise documented below in the visit note. 

## 2016-10-25 NOTE — Progress Notes (Signed)
Subjective:  Patient ID: Nicholas Singleton, male    DOB: 12/28/1955  Age: 61 y.o. MRN: [id number]  CC: The primary encounter diagnosis was Pure hypercholesterolemia. Diagnoses of Weight gain, Pain of right hip joint, Drug-induced impotence, and Loss of weight were also pertinent to this visit.  HPI Nicholas Singleton presents for multiple complaints:  1) evaluation of hip and knee pain involving the Right side  For the past 6 months   His pain is aggravated with even brief episodes of inactivity,  And is most troublesome getting out of bed, rising from a seated position and ascending a staircase but not descending .  Does not hurt to kneel.  He has no history of fall,  Tried taking one aleve which he states did not help.    He has been exercising at a local gym daily for 2 hours per day for the past 6 months,  after retiring last July. But ,he has no pain during his workouts.  He does not use a trainer after the initial introductory month    Denies knee swelling and giving way.  Doing a lot of squats and walking on treadmill    2) Not sleeping well.  Not sleeping as much during the day since he started exercising regularly.  Very frustrated by his inability to achieve an erection and orgasm despite masturbating for 1-2 hours per night.  Pump use tried, but it was painful.  Has seen urology Patsi Sears in [place]) , did not feel that he was given a thorough evaluation and discussion of treatment options because he was told that his problem was due to his medications for spasmodic dysphonia  and his age. 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   Outpatient Medications Prior to Visit  Medication Sig Dispense Refill  . ALPRAZolam (XANAX) 0.5 MG tablet Take 0.25 mg by mouth as needed.     Marland Kitchen amitriptyline (ELAVIL) 10 MG tablet Take 20 mg by mouth at bedtime.     .  Efinaconazole 10 % SOLN Reported on 12/07/2015    . Eszopiclone (ESZOPICLONE) 3 MG TABS Take 3 mg by mouth at bedtime. Take immediately before bedtime    . ketoconazole (NIZORAL) 2 % cream Apply 1 application topically daily. Reported on 12/07/2015    . Multiple Vitamin (MULTIVITAMIN) tablet Take 1 tablet by mouth daily.    . naftifine (NAFTIN) 1 % cream Apply 1 application topically daily. Reported on 12/07/2015    . tetrabenazine (XENAZINE) 25 MG tablet Take 12.5 mg by mouth 4 (four) times daily.     . trihexyphenidyl (ARTANE) 2 MG tablet Take 1 mg by mouth 2 (two) times daily with a meal.     . tadalafil (CIALIS) 20 MG tablet Take 0.5-1 tablets (10-20 mg total) by mouth every other day as needed for erectile dysfunction. 5 tablet 11   No facility-administered medications prior to visit.     Review of Systems;  Patient denies headache, fevers, malaise, unintentional weight loss, skin rash, eye pain, sinus congestion and sinus pain, sore throat, dysphagia,  hemoptysis , cough, dyspnea, wheezing, chest pain, palpitations, orthopnea, edema, abdominal pain, nausea, melena, diarrhea, constipation, flank pain, dysuria, hematuria, urinary  Frequency, nocturia, numbness, tingling, seizures,  Focal weakness, Loss of consciousness,  Tremor,epression, anxiety, and suicidal ideation.      Objective:  BP 104/70  Pulse 92   Resp 16   Wt 170 lb (77.1 kg)   SpO2 98%   BMI 25.10 kg/m   BP Readings from Last 3 Encounters:  10/25/16 104/70  12/07/15 92/60  12/29/13 112/72    Wt Readings from Last 3 Encounters:  10/25/16 170 lb (77.1 kg)  12/07/15 161 lb 8 oz (73.3 kg)  02/05/15 158 lb (71.7 kg)    General appearance: alert, cooperative and appears stated age Neck: no adenopathy, no carotid bruit, supple, symmetrical, trachea midline and thyroid not enlarged, symmetric, no tenderness/mass/nodules Back: symmetric, no curvature. ROM normal. No CVA tenderness. Lungs: clear to auscultation  bilaterally Heart: regular rate and rhythm, S1, S2 normal, no murmur, click, rub or gallop Abdomen: soft, non-tender; bowel sounds normal; no masses,  no organomegaly Pulses: 2+ and symmetric Skin: Skin color, texture, turgor normal. No rashes or lesions Ext: right hip pain with internal rotation and extension. Knee with mild creiptus negative drawer sing, no effusion.  Lymph nodes: Cervical, supraclavicular, and axillary nodes normal. Psych: affect flat,, makes good eye contact. No fidgeting,  Smiles easily.  Denies suicidal thoughts    No results found for: HGBA1C  Lab Results  Component Value Date   CREATININE 1.08 10/25/2016   CREATININE 1.03 12/07/2015   CREATININE 0.92 02/03/2015    Lab Results  Component Value Date   WBC 5.2 12/07/2015   HGB 13.6 12/07/2015   HCT 41.2 12/07/2015   PLT 184.0 12/07/2015   GLUCOSE 90 10/25/2016   CHOL 193 10/25/2016   TRIG 47.0 10/25/2016   HDL 58.90 10/25/2016   LDLDIRECT 155.2 12/29/2013   LDLCALC 124 (H) 10/25/2016   ALT 30 10/25/2016   AST 35 10/25/2016   NA 138 10/25/2016   K 4.1 10/25/2016   CL 102 10/25/2016   CREATININE 1.08 10/25/2016   BUN 21 10/25/2016   CO2 32 10/25/2016   TSH 0.73 10/25/2016   PSA 1.19 02/03/2015    No results found.  Assessment & Plan:   Problem List Items Addressed This Visit    Drug-induced impotence    [facility] opinion at [facility] requested.  rx for cialis and levitra given       Relevant Orders   Ambulatory referral to Urology   Loss of weight    Resolved, with improved appetite and gain of muscle tone .  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 Since his pain is not present with workouts and activity, no further action required at this point except to consider modifiy his workout routine to avoid squats and lunges.  Trial fo fish oil, glucosamine and tumeric discussed       Relevant Orders   DG HIP UNILAT  WITH PELVIS 2-3 VIEWS RIGHT (Completed)    Other Visit Diagnoses    Pure hypercholesterolemia    -  Primary   Relevant Medications   tadalafil (CIALIS) 20 MG tablet   vardenafil (LEVITRA) 10 MG tablet   Other Relevant Orders   Lipid panel (Completed)   Weight gain       Relevant Orders   Comprehensive metabolic panel (Completed)   TSH (Completed)     A total of 40 minutes was spent with patient more than half of which was spent in counseling patient on the above mentioned issues , reviewing and explaining recent labs and imaging studies done, and coordination of care. I have changed Mr.  Commerford's tadalafil. I am also having him start on vardenafil. Additionally, I am having him maintain his multivitamin, trihexyphenidyl, tetrabenazine, Eszopiclone, ALPRAZolam, ketoconazole, Efinaconazole, naftifine, amitriptyline, and clonazePAM.  Meds ordered this encounter  Medications  . clonazePAM (KLONOPIN) 0.5 MG tablet    Sig: Take 1 tablet by mouth 4 (four) times daily as needed.  . tadalafil (CIALIS) 20 MG tablet    Sig: Take 0.5 tablets (10 mg total) by mouth every other day as needed for erectile dysfunction.    Dispense:  10 tablet    Refill:  11  . vardenafil (LEVITRA) 10 MG tablet    Sig: Take 1 tablet (10 mg total) by mouth daily as needed for erectile dysfunction.    Dispense:  10 tablet    Refill:  0    Medications Discontinued During This Encounter  Medication Reason  . tadalafil (CIALIS) 20 MG tablet Reorder    Follow-up: No Follow-up on file.   Sherlene ShamsULLO, Favor Kreh L, MD

## 2016-10-25 NOTE — Patient Instructions (Signed)
Nicholas Singleton can definitely drink 2 protein drinks daily   Fiorucci mozzarella/prosciutto wraps  All cheese   The  diet I discussed with you today is the 10 day Green Smoothie Cleansing /Detox Diet by Nicholas DareJJ Singleton . available on Amazon for around $10.  Also has a 30 day smoothie diet  With great recipes

## 2016-10-26 ENCOUNTER — Encounter: Payer: Self-pay | Admitting: Internal Medicine

## 2016-10-26 DIAGNOSIS — M25551 Pain in right hip: Secondary | ICD-10-CM | POA: Insufficient documentation

## 2016-10-26 NOTE — Assessment & Plan Note (Signed)
Resolved, with improved appetite and gain of muscle tone .  High protein diet recommended and discussed

## 2016-10-26 NOTE — Assessment & Plan Note (Signed)
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 Since his pain is not present with workouts and activity, no further action required at this point except to consider modifiy his workout routine to avoid squats and lunges.  Trial fo fish oil, glucosamine and tumeric discussed

## 2016-10-26 NOTE — Assessment & Plan Note (Signed)
[facility] opinion at [facility].  rx for cialis and levitra given

## 2017-02-05 ENCOUNTER — Encounter: Payer: Self-pay | Admitting: Internal Medicine

## 2017-02-07 NOTE — Telephone Encounter (Signed)
Message was forwarded to me. Pt requesting a referral to dermatology. No referral in chart. Please advise.

## 2017-02-09 ENCOUNTER — Other Ambulatory Visit: Payer: Self-pay | Admitting: Internal Medicine

## 2017-02-09 DIAGNOSIS — L309 Dermatitis, unspecified: Secondary | ICD-10-CM

## 2017-04-03 DIAGNOSIS — R490 Dysphonia: Secondary | ICD-10-CM | POA: Insufficient documentation

## 2017-04-18 ENCOUNTER — Telehealth: Payer: Self-pay | Admitting: Internal Medicine

## 2017-04-18 NOTE — Telephone Encounter (Signed)
Placed in red folder  

## 2017-04-18 NOTE — Telephone Encounter (Signed)
Pt dropped off a handicapp renewal form to be completed by Dr. Darrick Huntsmanullo. Form is up front in Tullo's color folder.

## 2017-04-19 NOTE — Telephone Encounter (Signed)
LMTCB. Need to let pt know that his handicap placard paperwork is complete and ready to be picked up. Paperwork has been placed in folder up front.

## 2017-04-20 DIAGNOSIS — M179 Osteoarthritis of knee, unspecified: Secondary | ICD-10-CM | POA: Insufficient documentation

## 2017-05-28 ENCOUNTER — Encounter: Payer: Self-pay | Admitting: Internal Medicine

## 2017-06-25 ENCOUNTER — Telehealth: Payer: Self-pay | Admitting: Internal Medicine

## 2017-06-25 DIAGNOSIS — Z1211 Encounter for screening for malignant neoplasm of colon: Secondary | ICD-10-CM

## 2017-06-25 NOTE — Telephone Encounter (Signed)
Pt would like referral for colonoscopy to where his wife had her's done. Looked in the pt's wife's chart and she had her colonoscopy done at [facility] by Dr. Loreta AveMann.

## 2017-06-25 NOTE — Telephone Encounter (Signed)
Pt is requesting a referral for a colonoscopy. He would like to have the same physician that performed his wife's colonoscopy, in December 2017. His wife does not remember who did hers. He said the clinic was on [address] in [place].

## 2017-06-26 NOTE — Telephone Encounter (Signed)
  Your referral is in process as requested to [facility] .  Our referral coordinator will call you when the appointment has been made.

## 2017-06-26 NOTE — Telephone Encounter (Signed)
Spoke with both the Nicholas Singleton and the Nicholas Singleton's wife to let them know that Dr. Darrick Huntsmanullo has put in the referral for the colonoscopy.

## 2017-07-07 LAB — CBC AND DIFFERENTIAL
HCT: 43 (ref 41–53)
HEMOGLOBIN: 14 (ref 13.5–17.5)
Platelets: 190 (ref 150–399)
WBC: 3.8

## 2017-08-21 ENCOUNTER — Other Ambulatory Visit: Payer: Self-pay

## 2017-08-22 LAB — HM COLONOSCOPY

## 2017-08-24 ENCOUNTER — Ambulatory Visit: Payer: Self-pay | Admitting: *Deleted

## 2017-08-24 ENCOUNTER — Encounter: Payer: Self-pay | Admitting: Family Medicine

## 2017-08-24 ENCOUNTER — Ambulatory Visit (INDEPENDENT_AMBULATORY_CARE_PROVIDER_SITE_OTHER): Payer: Federal, State, Local not specified - PPO | Admitting: Family Medicine

## 2017-08-24 VITALS — BP 136/74 | HR 72 | Temp 98.9°F | Ht 69.0 in | Wt 176.0 lb

## 2017-08-24 DIAGNOSIS — R531 Weakness: Secondary | ICD-10-CM | POA: Diagnosis not present

## 2017-08-24 DIAGNOSIS — R490 Dysphonia: Secondary | ICD-10-CM | POA: Diagnosis not present

## 2017-08-24 LAB — B12 AND FOLATE PANEL
Folate: >23.9 ng/mL (ref >5.9)
Vitamin B-12: 527 pg/mL (ref 211–911)

## 2017-08-24 LAB — VITAMIN D 25 HYDROXY (VIT D DEFICIENCY, FRACTURES): VITD: 27.42 ng/mL — AB (ref 30.00–100.00)

## 2017-08-24 LAB — SEDIMENTATION RATE: SED RATE: 33 mm/h — AB (ref 0–20)

## 2017-08-24 LAB — C-REACTIVE PROTEIN: CRP: 0.1 mg/dL — AB (ref 0.5–20.0)

## 2017-08-24 MED ORDER — PREDNISONE 5 MG PO TABS: ORAL_TABLET | ORAL | 0 refills | Status: DC

## 2017-08-24 NOTE — Progress Notes (Addendum)
DONTAY Singleton - 61 y.o. male MRN [id number]  Date of birth: 1955-09-29  SUBJECTIVE:  Including CC & ROS.  Chief Complaint  Patient presents with  . Fatigue    Nicholas Singleton is a 61 y.o. male that is presenting with generalized weakness. States he has more upper body weakness. It has been ongoing for a couple of weeks to months, increasing the past few days. Denies fevers, chills or body aches. Denies any medication changes. Has been taking CBD oil but stopped last one week ago. He feels like his symptoms got worse recently which has prompted him to come into the office today. He feels that he has more weakness within his upper body. He denies any recent travel or vaccines. He has to take medications for his dysphonia and he feels that these make him fatigued and weak. If he does not take these medications and he is unable to breathe normally. He has trouble with some short-term memory loss when he takes these medications. Denies any history of any family with autoimmune disease. Has had no recent illnesses.  Review his lab work from November 2018 shows normal hemoglobin  Review his lab work from February 2018 shows normal electrolytes and kidney function and normal TSH.  He had lab work performed at [facility] in 2014 for autoimmune workup which were all negative.    Review of Systems  HISTORY: Past Medical, Surgical, Social, and Family History Reviewed & Updated per EMR.   Pertinent Historical Findings include:  Past Medical History:  Diagnosis Date  . Dysphonia     Past Surgical History:  Procedure Laterality Date  . TONSILLECTOMY      No Known Allergies  Family History  Problem Relation Age of Onset  . Hypertension Mother   . Diabetes Mother   . Hypertension Father   . Diabetes Father   . Stroke Father   . Cancer Brother 88       prostate ,       Social History   Socioeconomic History  . Marital status: Married    Spouse name: Not on file  . Number of children: Not  on file  . Years of education: Not on file  . Highest education level: Not on file  Social Needs  . Financial resource strain: Not on file  . Food insecurity - worry: Not on file  . Food insecurity - inability: Not on file  . Transportation needs - medical: Not on file  . Transportation needs - non-medical: Not on file  Occupational History  . Occupation: attorney    Employer: [employer]  Tobacco Use  . Smoking status: Never Smoker  . Smokeless tobacco: Never Used  Substance and Sexual Activity  . Alcohol use: Yes    Alcohol/week: 0.5 oz    Types: 1 drink(s) per week  . Drug use: No  . Sexual activity: Not on file  Other Topics Concern  . Not on file  Social History Narrative  . Not on file     PHYSICAL EXAM:  VS: BP 136/74 (BP Location: Left Arm, Patient Position: Sitting, Cuff Size: Normal)   Pulse 72   Temp 98.9 F (37.2 C) (Oral)   Ht 5' 9"  (1.753 m)   Wt 176 lb (79.8 kg)   SpO2 99%   BMI 25.99 kg/m  Physical Exam Gen: NAD, alert, cooperative with exam,  ENT: normal lips, normal nasal mucosa,  Eye: normal EOM, normal conjunctiva and lids CV:  no edema, +2 pedal  pulses   Resp: no accessory muscle use, non-labored,  Skin: no rashes, no areas of induration  Neuro: normal tone, normal sensation to touch Psych:  normal insight, alert and oriented MSK:  Normal gait. Upper extremities with no cogwheeling. Generalized weakness that is symmetric and roughly 4 out of 5 Able to rise from a seated position but not fluid. No excessive signs of atrophy Neurovascularly intact       ASSESSMENT & PLAN:   Weakness Unclear as to the reason for his intermittent symptoms. He was unable to get out of the bed today and that prompted his visit. He feels his lower extremities are stronger as opposed to his upper extremities. Denies any specific defects. Has not had any recent illnesses. Reports his medications were dysphonia cause him to be drowsy as well as have impotence and  short-term memory loss. Possible for polymyalgia rheumatica. Less likely for Parkinson's, multiple sclerosis or Alzheimer's - CRP, ESR, B12, vit D  - prednisone  - If no improvement with prednisone and lab testing is unrevealing consider CT or MRI brain

## 2017-08-24 NOTE — Telephone Encounter (Signed)
Pt's wife, Marcelino DusterMichelle, calling with pt beside her voicing complaints of generalized weakness for the past 2-3 weeks that is getting worse. Pt's wife states that the pt needs assistance to get out of some days and then on days like yesterday he is able to go to the gym and lift weights. Pt's wife states he does take medications for his dysphonia that make him drowsy and weak but he has been more than usual. No appts available with PCP at [facility]. [facility] location [facility] at 11:30. Reason for Disposition . [1] MODERATE weakness (i.e., interferes with work, school, normal activities) AND [2] persists > 3 days  Answer Assessment - Initial Assessment Questions 1. DESCRIPTION: "Describe how you are feeling."     Dizzy, lethargic, drowsy 2. SEVERITY: "How bad is it?"  "Can you stand and walk?"   - MILD - Feels weak or tired, but does not interfere with work, school or normal activities   - MODERATE - Able to stand and walk; weakness interferes with work, school, or normal activities   - SEVERE - Unable to stand or walk     Having trouble getting up from lying down to sitting up 3. ONSET:  "When did the weakness begin?"     2-3 weeks  4. CAUSE: "What do you think is causing the weakness?"     unsure 5. MEDICINES: "Have you recently started a new medicine or had a change in the amount of a medicine?"     Medications that make him drowsy: medication for dysphonia 6. OTHER SYMPTOMS: "Do you have any other symptoms?" (e.g., chest pain, fever, cough, SOB, vomiting, diarrhea, bleeding)     No  Protocols used: WEAKNESS (GENERALIZED) AND FATIGUE-A-AH

## 2017-08-24 NOTE — Patient Instructions (Signed)
We will call you with the results from today  Please try the prednisone and let me know how it does.

## 2017-08-24 NOTE — Assessment & Plan Note (Signed)
Unclear as to the reason for his intermittent symptoms. He was unable to get out of the bed today and that prompted his visit. He feels his lower extremities are stronger as opposed to his upper extremities. Denies any specific defects. Has not had any recent illnesses. Reports his medications were dysphonia cause him to be drowsy as well as have impotence and short-term memory loss. Possible for polymyalgia rheumatica. Less likely for Parkinson's, multiple sclerosis or Alzheimer's - CRP, ESR, B12, vit D  - prednisone  - If no improvement with prednisone and lab testing is unrevealing consider CT or MRI brain

## 2017-08-24 NOTE — Progress Notes (Deleted)
Nicholas Singleton - 61 y.o. male MRN [id number]  Date of birth: [birth year]  SUBJECTIVE:  Including CC & ROS.  Chief Complaint  Patient presents with  . Fatigue    Nicholas Singleton is a 61 y.o. male that is presenting with generalized weakness. States he has more upper body weakness. It has been ongoing for a couple of weeks to months, increasing the past few days. Denies fevers, chills or body aches. Denies any medication changes. Has been taking CBD oil but stopped last one week ago. He feels like his symptoms got worse recently which has prompted him to come into the office today. He feels that he has more weakness within his upper body. He denies any recent travel or vaccines. He has to take medications for his dysphonia and he feels that these make him fatigued and weak. If he does not take these medications and he is unable to breathe normally. He has trouble with some short-term memory loss when he takes these medications. Denies any history of any family with autoimmune disease. Has had no recent illnesses.  Review his lab work from November 2018 shows normal hemoglobin  Review his lab work from February 2018 shows normal electrolytes and kidney function and normal TSH.  He had lab work performed at [facility] in 2014 for autoimmune workup which were all negative.   Review of Systems  Constitutional: Negative for fever.  HENT: Positive for voice change.   Respiratory: Negative for cough.   Cardiovascular: Negative for chest pain.  Musculoskeletal: Negative for gait problem.  Skin: Negative for color change.  Neurological: Positive for weakness. Negative for numbness.    HISTORY: Past Medical, Surgical, Social, and Family History Reviewed & Updated per EMR.   Pertinent Historical Findings include:  Past Medical History:  Diagnosis Date  . Dysphonia     Past Surgical History:  Procedure Laterality Date  . TONSILLECTOMY      No Known Allergies  Family History  Problem Relation Age  of Onset  . Hypertension Mother   . Diabetes Mother   . Hypertension Father   . Diabetes Father   . Stroke Father   . Cancer Brother 24       prostate ,       Social History   Socioeconomic History  . Marital status: Married    Spouse name: Not on file  . Number of children: Not on file  . Years of education: Not on file  . Highest education level: Not on file  Social Needs  . Financial resource strain: Not on file  . Food insecurity - worry: Not on file  . Food insecurity - inability: Not on file  . Transportation needs - medical: Not on file  . Transportation needs - non-medical: Not on file  Occupational History  . Occupation: attorney    Employer: [employer]  Tobacco Use  . Smoking status: Never Smoker  . Smokeless tobacco: Never Used  Substance and Sexual Activity  . Alcohol use: Yes    Alcohol/week: 0.5 oz    Types: 1 drink(s) per week  . Drug use: No  . Sexual activity: Not on file  Other Topics Concern  . Not on file  Social History Narrative  . Not on file     PHYSICAL EXAM:  VS: BP 136/74 (BP Location: Left Arm, Patient Position: Sitting, Cuff Size: Normal)   Pulse 72   Temp 98.9 F (37.2 C) (Oral)   Ht 5' 9"  (1.753 m)  Wt 176 lb (79.8 kg)   SpO2 99%   BMI 25.99 kg/m  Physical Exam Gen: NAD, alert, cooperative with exam,  ENT: normal lips, normal nasal mucosa,  Eye: normal EOM, normal conjunctiva and lids CV:  no edema, +2 pedal pulses   Resp: no accessory muscle use, non-labored,  Skin: no rashes, no areas of induration  Neuro: normal tone, normal sensation to touch Psych:  normal insight, alert and oriented MSK:  Normal gait. Upper extremities with no cogwheeling. Generalized weakness that is symmetric and roughly 4 out of 5 Able to rise from a seated position but not fluid. No excessive signs of atrophy Neurovascularly intact      ASSESSMENT & PLAN:   Weakness Unclear as to the reason for his intermittent symptoms. He was unable to  get out of the bed today and that prompted his visit. He feels his lower extremities are stronger as opposed to his upper extremities. Denies any specific defects. Has not had any recent illnesses. Reports his medications were dysphonia cause him to be drowsy as well as have impotence and short-term memory loss. Possible for polymyalgia rheumatica. Less likely for Parkinson's, multiple sclerosis or Alzheimer's - CRP, ESR, B12, vit D  - prednisone  - If no improvement with prednisone and lab testing is unrevealing consider CT or MRI brain

## 2017-08-27 ENCOUNTER — Telehealth: Payer: Self-pay | Admitting: Family Medicine

## 2017-08-27 NOTE — Telephone Encounter (Signed)
Left VM for patient. If he calls back please have him speak with a nurse/CMA and inform that his vitamin D was low, we can replenish this if wants to try. His Sedimentation rate was mildly elevated but this isn't specific for any specific diagnosis. His other labs are normal.  If any questions then please take the best time and phone number to call and I will try to call him back.   Myra RudeSchmitz, Victoriana Aziz E, MD [facility] 08/27/2017, 8:35 AM

## 2017-08-28 LAB — ANA: Anti Nuclear Antibody(ANA): NEGATIVE

## 2017-11-16 ENCOUNTER — Encounter: Payer: Self-pay | Admitting: Internal Medicine

## 2017-11-19 MED ORDER — TADALAFIL 20 MG PO TABS: 10.0000 mg | ORAL_TABLET | ORAL | 11 refills | Status: DC | PRN

## 2018-01-26 ENCOUNTER — Inpatient Hospital Stay
Admission: EM | Admit: 2018-01-26 | Discharge: 2018-01-27 | DRG: 536 | Disposition: A | Discharge status: Other Acute Inpatient Hospital | Payer: Federal, State, Local not specified - PPO | Attending: Internal Medicine | Admitting: Internal Medicine

## 2018-01-26 ENCOUNTER — Encounter: Payer: Self-pay | Admitting: Emergency Medicine

## 2018-01-26 ENCOUNTER — Emergency Department: Payer: Federal, State, Local not specified - PPO

## 2018-01-26 ENCOUNTER — Other Ambulatory Visit: Payer: Self-pay

## 2018-01-26 DIAGNOSIS — M25552 Pain in left hip: Secondary | ICD-10-CM | POA: Diagnosis present

## 2018-01-26 DIAGNOSIS — Z8249 Family history of ischemic heart disease and other diseases of the circulatory system: Secondary | ICD-10-CM

## 2018-01-26 DIAGNOSIS — F5104 Psychophysiologic insomnia: Secondary | ICD-10-CM | POA: Diagnosis present

## 2018-01-26 DIAGNOSIS — S72009A Fracture of unspecified part of neck of unspecified femur, initial encounter for closed fracture: Secondary | ICD-10-CM | POA: Diagnosis present

## 2018-01-26 DIAGNOSIS — Z833 Family history of diabetes mellitus: Secondary | ICD-10-CM | POA: Diagnosis not present

## 2018-01-26 DIAGNOSIS — Z9089 Acquired absence of other organs: Secondary | ICD-10-CM | POA: Diagnosis not present

## 2018-01-26 DIAGNOSIS — S72002A Fracture of unspecified part of neck of left femur, initial encounter for closed fracture: Secondary | ICD-10-CM

## 2018-01-26 DIAGNOSIS — G249 Dystonia, unspecified: Secondary | ICD-10-CM | POA: Diagnosis present

## 2018-01-26 DIAGNOSIS — Y92008 Other place in unspecified non-institutional (private) residence as the place of occurrence of the external cause: Secondary | ICD-10-CM

## 2018-01-26 DIAGNOSIS — S72012A Unspecified intracapsular fracture of left femur, initial encounter for closed fracture: Principal | ICD-10-CM | POA: Diagnosis present

## 2018-01-26 DIAGNOSIS — K5909 Other constipation: Secondary | ICD-10-CM | POA: Diagnosis present

## 2018-01-26 DIAGNOSIS — R490 Dysphonia: Secondary | ICD-10-CM | POA: Diagnosis present

## 2018-01-26 DIAGNOSIS — Z823 Family history of stroke: Secondary | ICD-10-CM

## 2018-01-26 DIAGNOSIS — W01198A Fall on same level from slipping, tripping and stumbling with subsequent striking against other object, initial encounter: Secondary | ICD-10-CM | POA: Diagnosis present

## 2018-01-26 DIAGNOSIS — Z8042 Family history of malignant neoplasm of prostate: Secondary | ICD-10-CM

## 2018-01-26 DIAGNOSIS — Z79899 Other long term (current) drug therapy: Secondary | ICD-10-CM

## 2018-01-26 LAB — COMPREHENSIVE METABOLIC PANEL
ALBUMIN: 4.4 g/dL (ref 3.5–5.0)
ALT: 26 U/L (ref 17–63)
ANION GAP: 7 (ref 5–15)
AST: 37 U/L (ref 15–41)
Alkaline Phosphatase: 42 U/L (ref 38–126)
BUN: 23 mg/dL — AB (ref 6–20)
CHLORIDE: 101 mmol/L (ref 101–111)
CO2: 29 mmol/L (ref 22–32)
Calcium: 9.4 mg/dL (ref 8.9–10.3)
Creatinine, Ser: 1.22 mg/dL (ref 0.61–1.24)
GFR calc Af Amer: >60 mL/min (ref >60)
GFR calc non Af Amer: >60 mL/min (ref >60)
GLUCOSE: 106 mg/dL — AB (ref 65–99)
Potassium: 3.6 mmol/L (ref 3.5–5.1)
SODIUM: 137 mmol/L (ref 135–145)
Total Bilirubin: 0.7 mg/dL (ref 0.3–1.2)
Total Protein: 8.1 g/dL (ref 6.5–8.1)

## 2018-01-26 LAB — CBC WITH DIFFERENTIAL/PLATELET
BASOS ABS: 0 10*3/uL (ref 0–0.1)
Basophils Relative: 0 %
Eosinophils Absolute: 0 10*3/uL (ref 0–0.7)
Eosinophils Relative: 0 %
HCT: 41.3 % (ref 40.0–52.0)
HEMOGLOBIN: 13.7 g/dL (ref 13.0–18.0)
LYMPHS ABS: 0.6 10*3/uL — AB (ref 1.0–3.6)
LYMPHS PCT: 6 %
MCH: 29.5 pg (ref 26.0–34.0)
MCHC: 33.3 g/dL (ref 32.0–36.0)
MCV: 88.6 fL (ref 80.0–100.0)
Monocytes Absolute: 0.9 10*3/uL (ref 0.2–1.0)
Monocytes Relative: 10 %
NEUTROS PCT: 84 %
Neutro Abs: 7.2 10*3/uL — ABNORMAL HIGH (ref 1.4–6.5)
Platelets: 182 10*3/uL (ref 150–440)
RBC: 4.66 MIL/uL (ref 4.40–5.90)
RDW: 14.7 % — ABNORMAL HIGH (ref 11.5–14.5)
WBC: 8.6 10*3/uL (ref 3.8–10.6)

## 2018-01-26 LAB — SURGICAL PCR SCREEN
MRSA, PCR: NEGATIVE
STAPHYLOCOCCUS AUREUS: NEGATIVE

## 2018-01-26 LAB — TROPONIN I: Troponin I: <0.03 ng/mL (ref <0.03)

## 2018-01-26 MED ORDER — TETRABENAZINE 25 MG PO TABS: 12.5000 mg | ORAL_TABLET | Freq: Four times a day (QID) | ORAL | Status: DC

## 2018-01-26 MED ORDER — AMITRIPTYLINE HCL 10 MG PO TABS
20.0000 mg | ORAL_TABLET | Freq: Every day | ORAL | Status: DC
  Administered 2018-01-26: 20 mg via ORAL
  Filled 2018-01-26 (×2): qty 2

## 2018-01-26 MED ORDER — CLONAZEPAM 0.5 MG PO TABS
0.5000 mg | ORAL_TABLET | Freq: Four times a day (QID) | ORAL | Status: DC | PRN
  Administered 2018-01-26 – 2018-01-27 (×2): 0.5 mg via ORAL
  Filled 2018-01-26 (×2): qty 1

## 2018-01-26 MED ORDER — SODIUM CHLORIDE 0.9 % IV SOLN
INTRAVENOUS | Status: DC
  Administered 2018-01-26 – 2018-01-27 (×2): via INTRAVENOUS

## 2018-01-26 MED ORDER — ONDANSETRON HCL 4 MG/2ML IJ SOLN: 4.0000 mg | Freq: Four times a day (QID) | INTRAMUSCULAR | Status: DC | PRN

## 2018-01-26 MED ORDER — ALPRAZOLAM 0.25 MG PO TABS: 0.2500 mg | ORAL_TABLET | ORAL | Status: DC | PRN

## 2018-01-26 MED ORDER — TRIHEXYPHENIDYL HCL 2 MG PO TABS
1.0000 mg | ORAL_TABLET | Freq: Two times a day (BID) | ORAL | Status: DC
  Administered 2018-01-26 – 2018-01-27 (×2): 1 mg via ORAL
  Filled 2018-01-26 (×3): qty 1

## 2018-01-26 MED ORDER — TRANEXAMIC ACID 1000 MG/10ML IV SOLN: 1000.0000 mg | INTRAVENOUS | Status: DC

## 2018-01-26 MED ORDER — FAMOTIDINE IN NACL 20-0.9 MG/50ML-% IV SOLN
20.0000 mg | Freq: Two times a day (BID) | INTRAVENOUS | Status: DC
  Administered 2018-01-26: 20 mg via INTRAVENOUS
  Filled 2018-01-26: qty 50

## 2018-01-26 MED ORDER — DOCUSATE SODIUM 100 MG PO CAPS
100.0000 mg | ORAL_CAPSULE | Freq: Two times a day (BID) | ORAL | Status: DC
  Filled 2018-01-26: qty 1

## 2018-01-26 MED ORDER — TETRABENAZINE 12.5 MG PO TABS: 12.5000 mg | ORAL_TABLET | Freq: Four times a day (QID) | ORAL | Status: DC

## 2018-01-26 MED ORDER — ONDANSETRON HCL 4 MG PO TABS: 4.0000 mg | ORAL_TABLET | Freq: Four times a day (QID) | ORAL | Status: DC | PRN

## 2018-01-26 MED ORDER — CEFAZOLIN (ANCEF) 1 G IV SOLR: 2.0000 g | INTRAVENOUS | Status: DC

## 2018-01-26 MED ORDER — ENOXAPARIN SODIUM 40 MG/0.4ML ~~LOC~~ SOLN
40.0000 mg | SUBCUTANEOUS | Status: DC
  Administered 2018-01-26: 40 mg via SUBCUTANEOUS
  Filled 2018-01-26: qty 0.4

## 2018-01-26 MED ORDER — ADULT MULTIVITAMIN W/MINERALS CH: 1.0000 | ORAL_TABLET | Freq: Every day | ORAL | Status: DC

## 2018-01-26 MED ORDER — ACETAMINOPHEN 650 MG RE SUPP: 650.0000 mg | Freq: Four times a day (QID) | RECTAL | Status: DC | PRN

## 2018-01-26 MED ORDER — MUPIROCIN 2 % EX OINT
1.0000 "application " | TOPICAL_OINTMENT | Freq: Two times a day (BID) | CUTANEOUS | Status: DC
  Administered 2018-01-26: 1 via NASAL
  Filled 2018-01-26: qty 22

## 2018-01-26 MED ORDER — HYDROCODONE-ACETAMINOPHEN 10-325 MG PO TABS
1.0000 | ORAL_TABLET | ORAL | Status: DC | PRN
  Administered 2018-01-26: 1 via ORAL
  Filled 2018-01-26: qty 1

## 2018-01-26 MED ORDER — MORPHINE SULFATE (PF) 2 MG/ML IV SOLN
2.0000 mg | INTRAVENOUS | Status: DC | PRN
  Administered 2018-01-27: 2 mg via INTRAVENOUS
  Filled 2018-01-26: qty 1

## 2018-01-26 MED ORDER — BISACODYL 10 MG RE SUPP: 10.0000 mg | Freq: Every day | RECTAL | Status: DC | PRN

## 2018-01-26 MED ORDER — ZOLPIDEM TARTRATE 5 MG PO TABS: 5.0000 mg | ORAL_TABLET | Freq: Every evening | ORAL | Status: DC | PRN

## 2018-01-26 MED ORDER — ACETAMINOPHEN 325 MG PO TABS
650.0000 mg | ORAL_TABLET | Freq: Four times a day (QID) | ORAL | Status: DC | PRN
  Administered 2018-01-26: 650 mg via ORAL
  Filled 2018-01-26: qty 2

## 2018-01-26 NOTE — Progress Notes (Signed)
This nurse went  In to room to get patient admitted, Pts wife at bedside stating " the would like to be transferred to a different hospital". MD Sparks paged and notified.

## 2018-01-26 NOTE — Discharge Summary (Signed)
[facility] [place] at [facility]   PATIENT NAME: Nicholas Singleton    MR#:  [id number]  DATE OF BIRTH:  October 11, 1955  DATE OF ADMISSION:  01/26/2018 ADMITTING PHYSICIAN: Marguarite Arbour, MD  DATE OF DISCHARGE: No discharge date for patient encounter.  PRIMARY CARE PHYSICIAN: Sherlene Shams, MD    ADMISSION DIAGNOSIS:  Closed left hip fracture, initial encounter (HCC) [S72.002A]  DISCHARGE DIAGNOSIS:  Principal Problem:   Closed left hip fracture (HCC) Active Problems:   Dysphonia   Constipation, chronic   Chronic insomnia   Hip fracture (HCC)   SECONDARY DIAGNOSIS:   Past Medical History:  Diagnosis Date  . Dysphonia     HOSPITAL COURSE:   *Acute hip fracture Stable Transfer to [facility] hospital per patient/family request  DISCHARGE CONDITIONS:   stable  CONSULTS OBTAINED:  Treatment Team:  Carnella Guadalajara, DO  DRUG ALLERGIES:  No Known Allergies  DISCHARGE MEDICATIONS:   Allergies as of 01/26/2018   No Known Allergies     Medication List    TAKE these medications   ALPRAZolam 0.5 MG tablet Commonly known as:  XANAX Take 0.25 mg by mouth as needed.   amitriptyline 10 MG tablet Commonly known as:  ELAVIL Take 20 mg by mouth at bedtime.   clonazePAM 0.5 MG tablet Commonly known as:  KLONOPIN Take 1 tablet by mouth 4 (four) times daily as needed.   eszopiclone 3 MG Tabs Generic drug:  Eszopiclone Take 3 mg by mouth at bedtime. Take immediately before bedtime   multivitamin tablet Take 1 tablet by mouth daily.   tadalafil 20 MG tablet Commonly known as:  ADCIRCA/CIALIS Take 0.5 tablets (10 mg total) by mouth every other day as needed for erectile dysfunction.   tetrabenazine 25 MG tablet Commonly known as:  XENAZINE Take 12.5 mg by mouth 4 (four) times daily.   trihexyphenidyl 2 MG tablet Commonly known as:  ARTANE Take 1 mg by mouth 2 (two) times daily with a meal.        DISCHARGE INSTRUCTIONS:     If you experience worsening of your admission symptoms, develop shortness of breath, life threatening emergency, suicidal or homicidal thoughts you must seek medical attention immediately by calling 911 or calling your MD immediately  if symptoms less severe.  You Must read complete instructions/literature along with all the possible adverse reactions/side effects for all the Medicines you take and that have been prescribed to you. Take any new Medicines after you have completely understood and accept all the possible adverse reactions/side effects.   Please note  You were cared for by a hospitalist during your hospital stay. If you have any questions about your discharge medications or the care you received while you were in the hospital after you are discharged, you can call the unit and asked to speak with the hospitalist on call if the hospitalist that took care of you is not available. Once you are discharged, your primary care physician will handle any further medical issues. Please note that NO REFILLS for any discharge medications will be authorized once you are discharged, as it is imperative that you return to your primary care physician (or establish a relationship with a primary care physician if you do not have one) for your aftercare needs so that they can reassess your need for medications and monitor your lab values.    Today   CHIEF COMPLAINT:   Chief Complaint  Patient presents with  . Fall  HISTORY OF PRESENT ILLNESS:  62 year old male status post mechanical fall resulting in hip pain   VITAL SIGNS:  Blood pressure 133/90, pulse 88, temperature 98.7 F (37.1 C), temperature source Oral, resp. rate 14, height  (1.778 m), weight 75.8 kg (167 lb), SpO2 99 %.  I/O:  No intake or output data in the 24 hours ending 01/26/18 2117  PHYSICAL EXAMINATION:  GENERAL:  62 y.o.-year-old patient lying in the bed with no acute distress.  EYES: Pupils equal, round,  reactive to light and accommodation. No scleral icterus. Extraocular muscles intact.  HEENT: Head atraumatic, normocephalic. Oropharynx and nasopharynx clear.  NECK:  Supple, no jugular venous distention. No thyroid enlargement, no tenderness.  LUNGS: Normal breath sounds bilaterally, no wheezing, rales,rhonchi or crepitation. No use of accessory muscles of respiration.  CARDIOVASCULAR: S1, S2 normal. No murmurs, rubs, or gallops.  ABDOMEN: Soft, non-tender, non-distended. Bowel sounds present. No organomegaly or mass.  EXTREMITIES: No pedal edema, cyanosis, or clubbing.  NEUROLOGIC: Cranial nerves II through XII are intact. Muscle strength 5/5 in all extremities. Sensation intact. Gait not checked.  PSYCHIATRIC: The patient is alert and oriented x 3.  SKIN: No obvious rash, lesion, or ulcer.   DATA REVIEW:   CBC Recent Labs  Lab 01/26/18 1441  WBC 8.6  HGB 13.7  HCT 41.3  PLT 182    Chemistries  Recent Labs  Lab 01/26/18 1441  NA 137  K 3.6  CL 101  CO2 29  GLUCOSE 106*  BUN 23*  CREATININE 1.22  CALCIUM 9.4  AST 37  ALT 26  ALKPHOS 42  BILITOT 0.7    Cardiac Enzymes Recent Labs  Lab 01/26/18 1441  TROPONINI <0.03    Microbiology Results  Results for orders placed or performed in visit on 11/19/13  Culture, fungus without smear     Status: None   Collection Time: 11/20/13  1:51 PM  Result Value Ref Range Status   Organism ID, Bacteria No Fungi Isolated in 4 Weeks    Final    RADIOLOGY:  Dg Chest 1 View  Result Date: 01/26/2018 CLINICAL DATA:  Pain following fall EXAM: CHEST  1 VIEW COMPARISON:  None. FINDINGS: Lungs are clear. Heart size and pulmonary vascularity are normal. No adenopathy. No pneumothorax. No bone lesions. IMPRESSION: No edema or consolidation. Electronically Signed   By: Bretta Bang III M.D.   On: 01/26/2018 14:18   Dg Hip Unilat With Pelvis 2-3 Views Left  Result Date: 01/26/2018 CLINICAL DATA:  Pain following fall EXAM: DG HIP  (WITH OR WITHOUT PELVIS) 2-3V LEFT COMPARISON:  None. FINDINGS: Frontal pelvis as well as frontal and lateral left hip images were obtained. There is a fracture through the midportion of the left femoral neck as well as impaction in the subcapital femoral neck region on the left. Alignment near anatomic. No other fractures are identified. No dislocation. There is symmetric narrowing of both hip joints, moderate. Bones are osteoporotic. IMPRESSION: Left femoral neck fracture midportion as well as impaction in the subcapital femoral neck region. Alignment near anatomic. No other fractures. No dislocation. Moderate symmetric narrowing both hip joints. Bones are osteoporotic. Electronically Signed   By: Bretta Bang III M.D.   On: 01/26/2018 14:17    EKG:   Orders placed or performed during the hospital encounter of 01/26/18  . ED EKG  . ED EKG  . EKG 12-Lead  . EKG 12-Lead      Management plans discussed with the patient, family and  they are in agreement.  CODE STATUS:     Code Status Orders  (From admission, onward)        Start     Ordered   01/26/18 1630  Full code  Continuous     01/26/18 1629    Code Status History    Date Active Date Inactive Code Status Order ID Comments User Context   01/26/2018 1609 01/26/2018 1629 Full Code [id number]  Carnella Guadalajara, DO Inpatient    Advance Directive Documentation     Most Recent Value  Type of Advance Directive  Healthcare Power of Attorney, Living will  Pre-existing out of facility DNR order (yellow form or pink MOST form)  -  "MOST" Form in Place?  -      TOTAL TIME TAKING CARE OF THIS PATIENT: 35 minutes.    Evelena Asa Salary M.D on 01/26/2018 at 9:17 PM  Between 7am to 6pm - Pager - [phone]  After 6pm go to [email or web address] - password [email or web address]  [facility]  Office  [phone]  CC: Primary care physician; Sherlene Shams, MD   Note: This dictation was prepared with Dragon dictation along with  smaller phrase technology. Any transcriptional errors that result from this process are unintentional.

## 2018-01-26 NOTE — ED Triage Notes (Signed)
Pt to ED from home c/o fall this morning.  States was sleeping walking and ran into a door and fell onto left side, pain mainly to left hip, denies hitting head. States was able to ambulate with assistance and very painful.

## 2018-01-26 NOTE — Consult Note (Signed)
Reason for Consult: L Hip fracture    Nicholas Singleton is an 62 y.o. male.  HPI: Nicholas Singleton is a 61 y.o. 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 He fell into a wall and landed on his left hip. Pt c/o significant pain with weightbearing and has tried to ambulate on the left leg. At baseline patient denies using an assistive device at home but ambulates for short distances due to his motor disorder. Pt denies prior history of hip pain/injury.   Past Medical History:  Diagnosis Date  . Dysphonia     Past Surgical History:  Procedure Laterality Date  . TONSILLECTOMY      Family History  Problem Relation Age of Onset  . Hypertension Mother   . Diabetes Mother   . Hypertension Father   . Diabetes Father   . Stroke Father   . Cancer Brother 66       prostate ,      Social History:  reports that he has never smoked. He has never used smokeless tobacco. He reports that he drinks about 0.5 oz of alcohol per week. He reports that he does not use drugs.  Allergies: No Known Allergies  Medications: I have reviewed the patient's current medications.  Results for orders placed or performed during the hospital encounter of 01/26/18 (from the past 48 hour(s))  Comprehensive metabolic panel     Status: Abnormal   Collection Time: 01/26/18  2:41 PM  Result Value Ref Range   Sodium 137 135 - 145 mmol/L   Potassium 3.6 3.5 - 5.1 mmol/L   Chloride 101 101 - 111 mmol/L   CO2 29 22 - 32 mmol/L   Glucose, Bld 106 (H) 65 - 99 mg/dL   BUN 23 (H) 6 - 20 mg/dL   Creatinine, Ser 1.22 0.61 - 1.24 mg/dL   Calcium 9.4 8.9 - 10.3 mg/dL   Total Protein 8.1 6.5 - 8.1 g/dL   Albumin 4.4 3.5 - 5.0 g/dL   AST 37 15 - 41 U/L   ALT 26 17 - 63 U/L   Alkaline Phosphatase 42 38 - 126 U/L   Total Bilirubin 0.7 0.3 - 1.2 mg/dL   GFR calc non Af Amer >60 >60 mL/min   GFR calc Af Amer >60 >60 mL/min    Comment: (NOTE) The  eGFR has been calculated using the CKD EPI equation. This calculation has not been validated in all clinical situations. eGFR's persistently <60 mL/min signify possible Chronic Kidney Disease.    Anion gap 7 5 - 15    Comment: Performed at [facility], [address] [place], [place] [address]  Troponin I     Status: None   Collection Time: 01/26/18  2:41 PM  Result Value Ref Range   Troponin I <0.03 <0.03 ng/mL    Comment: Performed at [facility], [address] [place], [place] [address]  CBC with Differential     Status: Abnormal   Collection Time: 01/26/18  2:41 PM  Result Value Ref Range   WBC 8.6 3.8 - 10.6 K/uL   RBC 4.66 4.40 - 5.90 MIL/uL   Hemoglobin 13.7 13.0 - 18.0 g/dL   HCT 41.3 40.0 - 52.0 %   MCV 88.6 80.0 - 100.0 fL   MCH 29.5 26.0 - 34.0 pg   MCHC 33.3 32.0 - 36.0 g/dL   RDW 14.7 (H) 11.5 -  14.5 %   Platelets 182 150 - 440 K/uL   Neutrophils Relative % 84 %   Neutro Abs 7.2 (H) 1.4 - 6.5 K/uL   Lymphocytes Relative 6 %   Lymphs Abs 0.6 (L) 1.0 - 3.6 K/uL   Monocytes Relative 10 %   Monocytes Absolute 0.9 0.2 - 1.0 K/uL   Eosinophils Relative 0 %   Eosinophils Absolute 0.0 0 - 0.7 K/uL   Basophils Relative 0 %   Basophils Absolute 0.0 0 - 0.1 K/uL    Comment: Performed at [facility], [address] [place], [place] [address]    Dg Chest 1 View  Result Date: 01/26/2018 CLINICAL DATA:  Pain following fall EXAM: CHEST  1 VIEW COMPARISON:  None. FINDINGS: Lungs are clear. Heart size and pulmonary vascularity are normal. No adenopathy. No pneumothorax. No bone lesions. IMPRESSION: No edema or consolidation. Electronically Signed   By: Lowella Grip III M.D.   On: 01/26/2018 14:18   Dg Hip Unilat With Pelvis 2-3 Views Left  Result Date: 01/26/2018 CLINICAL DATA:  Pain following fall EXAM: DG HIP (WITH OR WITHOUT PELVIS) 2-3V LEFT COMPARISON:  None. FINDINGS: Frontal pelvis as well as frontal and lateral left hip images  were obtained. There is a fracture through the midportion of the left femoral neck as well as impaction in the subcapital femoral neck region on the left. Alignment near anatomic. No other fractures are identified. No dislocation. There is symmetric narrowing of both hip joints, moderate. Bones are osteoporotic. IMPRESSION: Left femoral neck fracture midportion as well as impaction in the subcapital femoral neck region. Alignment near anatomic. No other fractures. No dislocation. Moderate symmetric narrowing both hip joints. Bones are osteoporotic. Electronically Signed   By: Lowella Grip III M.D.   On: 01/26/2018 14:17    ROS Left hip pain.  Ambulatory dysfunction. All other ROS tested and were negative.  Blood pressure 117/84, pulse 95, temperature 98.6 F (37 C), temperature source Oral, resp. rate 14, height 5' 10"  (1.778 m), weight 75.8 kg (167 lb), SpO2 96 %.   Physical Exam  Left lower extremity with mild shortening/external rotation. TTP laterally over trochanter and anterior hip. Mild diffuse swelling laterally. No open wound. Thigh compartments soft. ROM deferred due to known fracture L knee nontender. No effusion.   5/5 PF/DF/EHL LLE. Sensation intact L4-S1 distally. + DP/PT pulses distally All other bilateral upper and lower extremity bony prominences palpated without step-off, tenderness, or deformity.  Assessment/Plan: 1. L Femoral neck fracture -Recommendations for left hip hemiarthroplasty were made to patient and his family today.  Alternatives were discussed. Recommend hemiarthroplasty for immediate mobilization and weight bearing.  All risks, benefits, and alternatives were discussed.  Risks to include but not limited to: infection, blood loss, neurovascular injury, blood clots, dislocation, fracture, leg length discrepancy, loosening of components requiring revision, progression of osteoarthritis/acetabular wear, any risks of anesthesia, or need for other procedures were  discussed.  -NPO after midnight -hold anticoagulation and resume 12-24 hours postop -on call to OR Sunday 5/26 for L hip hemiarthroplasty.   Jaymes Graff 01/26/2018, 3:50 PM

## 2018-01-26 NOTE — ED Provider Notes (Signed)
[facility] Emergency Department Provider Note   ____________________________________________   First MD Initiated Contact with Patient 01/26/18 1412     (approximate)  I have reviewed the triage vital signs and the nursing notes.   HISTORY  Chief Complaint Fall   HPI Nicholas Singleton is a 62 y.o. male Who has a history of dysphonia got up from the couch and was walking to the bathroom walked into the wall by accident and fell down early hurt his hip was able to get up with help walk to the bathroom use the bathroom came back but then later when he had to get up again he couldn't. The patient himself thinks he passed out but his wife who is with him said she got to room him within 5 seconds and he was not passed out. His cocktail of Artane adenosine and Klonopin makes him a little sleepy but it is helping with his dysphonia. He often gets up and walks to the bathroom more gets up from lying down and walks around with his eyes closed when he is very sleepy and both the patient and his wife thinks that this is what happened. Patient reports no headache neck pain chest pain belly pain and arm pain only pain in his left hip.   Past Medical History:  Diagnosis Date  . Dysphonia     Patient Active Problem List   Diagnosis Date Noted  . Weakness 08/24/2017  . Pain of right hip joint 10/26/2016  . Loss of weight 02/07/2015  . Onychomycosis 11/20/2013  . Cognitive decline 08/26/2013  . Dystonia 11/28/2012  . 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 Weight gain due to medication 10/21/2012  . Routine general medical examination at a health care facility 04/21/2012  . Constipation, chronic 01/18/2012  . Dysphonia     Past Surgical History:  Procedure Laterality Date  . TONSILLECTOMY      Prior to Admission medications   Medication Sig Start Date End Date Taking? Authorizing Provider  ALPRAZolam Prudy Feeler) 0.5 MG tablet Take 0.25  mg by mouth as needed.     [provider]  amitriptyline (ELAVIL) 10 MG tablet Take 20 mg by mouth at bedtime.  02/02/13   [provider]  clonazePAM (KLONOPIN) 0.5 MG tablet Take 1 tablet by mouth 4 (four) times daily as needed. 07/24/16   [provider]  Eszopiclone (ESZOPICLONE) 3 MG TABS Take 3 mg by mouth at bedtime. Take immediately before bedtime    [provider]  Multiple Vitamin (MULTIVITAMIN) tablet Take 1 tablet by mouth daily.    [provider]  tadalafil (CIALIS) 20 MG tablet Take 0.5 tablets (10 mg total) by mouth every other day as needed for erectile dysfunction. 11/19/17   Sherlene Shams, MD  tetrabenazine Guinevere Scarlet) 25 MG tablet Take 12.5 mg by mouth 4 (four) times daily.     [provider]  trihexyphenidyl (ARTANE) 2 MG tablet Take 1 mg by mouth 2 (two) times daily with a meal.     [provider]    Allergies Patient has no known allergies.  Family History  Problem Relation Age of Onset  . Hypertension Mother   . Diabetes Mother   . Hypertension Father   . Diabetes Father   . Stroke Father   . Cancer Brother 12       prostate ,      Social History Social History   Tobacco Use  .  Smoking status: Never Smoker  . Smokeless tobacco: Never Used  Substance Use Topics  . Alcohol use: Yes    Alcohol/week: 0.5 oz    Types: 1 Standard drinks or equivalent per week    Comment: twice a month  . Drug use: No    Review of Systems  Constitutional: No fever/chills Eyes: No visual changes. ENT: No sore throat. Cardiovascular: Denies chest pain. Respiratory: Denies shortness of breath. Gastrointestinal: No abdominal pain.  No nausea, no vomiting.  No diarrhea.  No constipation. Genitourinary: Negative for dysuria. Musculoskeletal: Negative for back pain. Skin: Negative for rash. Neurological: Negative for headaches, focal weakness  ____________________________________________   PHYSICAL  EXAM:  VITAL SIGNS: ED Triage Vitals [01/26/18 1336]  Enc Vitals Group     BP 106/66     Pulse Rate 91     Resp 16     Temp 98.6 F (37 C)     Temp Source Oral     SpO2 96 %     Weight 167 lb (75.8 kg)     Height  (1.778 m)     Head Circumference      Peak Flow      Pain Score 10     Pain Loc      Pain Edu?      Excl. in GC?     Constitutional: Alert and oriented. Well appearing and in no acute distress. Eyes: Conjunctivae are normal.  Head: Atraumatic. Nose: No congestion/rhinnorhea. Mouth/Throat: Mucous membranes are moist.  Oropharynx non-erythematous. Neck: No stridor.  No cervical spine tenderness to palpation. Cardiovascular: Normal rate, regular rhythm. Grossly normal heart sounds.  Good peripheral circulation. Respiratory: Normal respiratory effort.  No retractions. Lungs CTAB. Gastrointestinal: Soft and nontender. No distention. No abdominal bruits. No CVA tenderness. Musculoskeletal:left hip is tender otherwise he has no musculoskeletal tenderness at all. Neurologic:  Normal speech and language. No gross focal neurologic deficits are appreciated. No gait instability. Skin:  Skin is warm, dry and intact. No rash noted. Psychiatric: Mood and affect are normal. Speech and behavior are normal.  ____________________________________________   LABS (all labs ordered are listed, but only abnormal results are displayed)  Labs Reviewed  COMPREHENSIVE METABOLIC PANEL  TROPONIN I  CBC WITH DIFFERENTIAL/PLATELET   ____________________________________________  EKG  EKG read and interpreted by me shows normal sinus rhythm rate of 89 normal axis essentially normal EKG ____________________________________________  RADIOLOGY  ED MD interpretation:  left hip fracture chest x-ray looks okay I reviewed both chest x-ray and hip films  Official radiology report(s): Dg Chest 1 View  Result Date: 01/26/2018 CLINICAL DATA:  Pain following fall EXAM: CHEST  1 VIEW  COMPARISON:  None. FINDINGS: Lungs are clear. Heart size and pulmonary vascularity are normal. No adenopathy. No pneumothorax. No bone lesions. IMPRESSION: No edema or consolidation. Electronically Signed   By: Bretta Bang III M.D.   On: 01/26/2018 14:18   Dg Hip Unilat With Pelvis 2-3 Views Left  Result Date: 01/26/2018 CLINICAL DATA:  Pain following fall EXAM: DG HIP (WITH OR WITHOUT PELVIS) 2-3V LEFT COMPARISON:  None. FINDINGS: Frontal pelvis as well as frontal and lateral left hip images were obtained. There is a fracture through the midportion of the left femoral neck as well as impaction in the subcapital femoral neck region on the left. Alignment near anatomic. No other fractures are identified. No dislocation. There is symmetric narrowing of both hip joints, moderate. Bones are osteoporotic. IMPRESSION: Left femoral neck fracture midportion as well  as impaction in the subcapital femoral neck region. Alignment near anatomic. No other fractures. No dislocation. Moderate symmetric narrowing both hip joints. Bones are osteoporotic. Electronically Signed   By: Bretta Bang III M.D.   On: 01/26/2018 14:17    ____________________________________________   PROCEDURES  Procedure(s) performed:   Procedures  Critical Care performed:   ____________________________________________   INITIAL IMPRESSION / ASSESSMENT AND PLAN / ED COURSE  will admit the patient to the hospitalist with were waiting for the rest of the labs to see him orthopedic surgery has been informed of the patient and will come to see him as well.  patient is not complaining of pain in fact he's not sure which she appeared broke because the right one is a little bit achy in the left foot is not tender to palpation.      ____________________________________________   FINAL CLINICAL IMPRESSION(S) / ED DIAGNOSES  Final diagnoses:  Closed left hip fracture, initial encounter [facility])     ED Discharge Orders     None       Note:  This document was prepared using Dragon voice recognition software and may include unintentional dictation errors.    Arnaldo Natal, MD 01/26/18 1451

## 2018-01-26 NOTE — Progress Notes (Signed)
Report called to Maralyn Sago, RN at [phone]

## 2018-01-26 NOTE — Progress Notes (Signed)
Pt and wife requesting to wait to have IV fluids started and connected  to telemetry until they decide if they are transferring to another hospital. Prime doc paged. MD Salary returned call and notified. No new new orders.

## 2018-01-26 NOTE — H&P (Signed)
History and Physical    Nicholas Singleton ZOX:[id number] DOB: April 13, 1956 DOA: 01/26/2018  Referring physician: Dr. Darnelle Catalan PCP: Sherlene Shams, MD  Specialists: none  Chief Complaint: hip pain  HPI: Nicholas Singleton is a 62 y.o. male has a past medical history significant for dysphonia and constipation now with mechanical fall and left hip pain. Fracture noted in ER. He is now admitted. No fever. Denies CP or SOB. No N/V/D. Denies syncope or palpitations. No change in bowels or bladder  Review of Systems: The patient denies anorexia, fever, weight loss,, vision loss, decreased hearing, hoarseness, chest pain, syncope, dyspnea on exertion, peripheral edema, balance deficits, hemoptysis, abdominal pain, melena, hematochezia, severe indigestion/heartburn, hematuria, incontinence, genital sores, muscle weakness, suspicious skin lesions, transient blindness, difficulty walking, depression, unusual weight change, abnormal bleeding, enlarged lymph nodes, angioedema, and breast masses.   Past Medical History:  Diagnosis Date  . Dysphonia    Past Surgical History:  Procedure Laterality Date  . TONSILLECTOMY     Social History:  reports that he has never smoked. He has never used smokeless tobacco. He reports that he drinks about 0.5 oz of alcohol per week. He reports that he does not use drugs.  No Known Allergies  Family History  Problem Relation Age of Onset  . Hypertension Mother   . Diabetes Mother   . Hypertension Father   . Diabetes Father   . Stroke Father   . Cancer Brother 82       prostate ,      Prior to Admission medications   Medication Sig Start Date End Date Taking? Authorizing Provider  ALPRAZolam Prudy Feeler) 0.5 MG tablet Take 0.25 mg by mouth as needed.     [provider]  amitriptyline (ELAVIL) 10 MG tablet Take 20 mg by mouth at bedtime.  02/02/13   [provider]  clonazePAM (KLONOPIN) 0.5 MG tablet Take 1 tablet by mouth 4 (four) times daily as  needed. 07/24/16   [provider]  Eszopiclone (ESZOPICLONE) 3 MG TABS Take 3 mg by mouth at bedtime. Take immediately before bedtime    [provider]  Multiple Vitamin (MULTIVITAMIN) tablet Take 1 tablet by mouth daily.    [provider]  tadalafil (CIALIS) 20 MG tablet Take 0.5 tablets (10 mg total) by mouth every other day as needed for erectile dysfunction. 11/19/17   Sherlene Shams, MD  tetrabenazine Guinevere Scarlet) 25 MG tablet Take 12.5 mg by mouth 4 (four) times daily.     [provider]  trihexyphenidyl (ARTANE) 2 MG tablet Take 1 mg by mouth 2 (two) times daily with a meal.     [provider]   Physical Exam: Vitals:   01/26/18 1336 01/26/18 1430  BP: 106/66 117/87  Pulse: 91 90  Resp: 16 16  Temp: 98.6 F (37 C)   TempSrc: Oral   SpO2: 96% 96%  Weight: 75.8 kg (167 lb)   Height:  (1.778 m)      General:  No apparent distress, WDWN, [place]/AT  Eyes: PERRL, EOMI, no scleral icterus, conjunctiva clear  ENT: moist oropharynx without exudate, TM's benign, dentition fair  Neck: supple, no lymphadenopathy. No bruits or thyromegaly  Cardiovascular: regular rate without MRG; 2+ peripheral pulses, no JVD, no peripheral edema  Respiratory: CTA biL, good air movement without wheezing, rhonchi or crackled. Respiratory effort normal  Abdomen: soft, non tender to palpation, positive bowel sounds, no guarding, no rebound  Skin: no rashes or lesions  Musculoskeletal: normal bulk and tone, no joint swelling  Psychiatric: normal mood and affect, A&OX3  Neurologic: CN 2-12 grossly intact, Motor strength 5/5 in all 4 groups with symmetric DTR's and non-focal sensory exam  Labs on Admission:  Basic Metabolic Panel: No results for input(s): NA, K, CL, CO2, GLUCOSE, BUN, CREATININE, CALCIUM, MG, PHOS in the last 168 hours. Liver Function Tests: No results for input(s): AST, ALT, ALKPHOS, BILITOT, PROT, ALBUMIN in the last 168  hours. No results for input(s): LIPASE, AMYLASE in the last 168 hours. No results for input(s): AMMONIA in the last 168 hours. CBC: No results for input(s): WBC, NEUTROABS, HGB, HCT, MCV, PLT in the last 168 hours. Cardiac Enzymes: No results for input(s): CKTOTAL, CKMB, CKMBINDEX, TROPONINI in the last 168 hours.  BNP (last 3 results) No results for input(s): BNP in the last 8760 hours.  ProBNP (last 3 results) No results for input(s): PROBNP in the last 8760 hours.  CBG: No results for input(s): GLUCAP in the last 168 hours.  Radiological Exams on Admission: Dg Chest 1 View  Result Date: 01/26/2018 CLINICAL DATA:  Pain following fall EXAM: CHEST  1 VIEW COMPARISON:  None. FINDINGS: Lungs are clear. Heart size and pulmonary vascularity are normal. No adenopathy. No pneumothorax. No bone lesions. IMPRESSION: No edema or consolidation. Electronically Signed   By: Bretta Bang III M.D.   On: 01/26/2018 14:18   Dg Hip Unilat With Pelvis 2-3 Views Left  Result Date: 01/26/2018 CLINICAL DATA:  Pain following fall EXAM: DG HIP (WITH OR WITHOUT PELVIS) 2-3V LEFT COMPARISON:  None. FINDINGS: Frontal pelvis as well as frontal and lateral left hip images were obtained. There is a fracture through the midportion of the left femoral neck as well as impaction in the subcapital femoral neck region on the left. Alignment near anatomic. No other fractures are identified. No dislocation. There is symmetric narrowing of both hip joints, moderate. Bones are osteoporotic. IMPRESSION: Left femoral neck fracture midportion as well as impaction in the subcapital femoral neck region. Alignment near anatomic. No other fractures. No dislocation. Moderate symmetric narrowing both hip joints. Bones are osteoporotic. Electronically Signed   By: Bretta Bang III M.D.   On: 01/26/2018 14:17    EKG: Independently reviewed.  Assessment/Plan Principal Problem:   Closed left hip fracture (HCC) Active  Problems:   Dysphonia   Constipation, chronic   Chronic insomnia   Will admit to floor. Consult Ortho for surgery. Consult PT and CSW. Continue outpatient meds. Repeat labs in AM  Diet: regular Fluids: NS@75  DVT Prophylaxis: Lovenox  Code Status: FULL  Family Communication: none  Disposition Plan: SNF  Time spent: 50 min

## 2018-01-26 NOTE — Progress Notes (Signed)
Assessment done. Pt awake in bed and approp. Wife at bedside. Updated on pending transfer per wife / family request to another facility. [facility] transport is working on arrangements. Call bell in reach. Needs met. Medicated for pain per mar.

## 2018-01-26 NOTE — Progress Notes (Signed)
Spoke with Dr Caryn Bee on phone and given update of  transfer not being able to take pt due to not being able to find correct form. Discharge order discontinued.

## 2018-01-26 NOTE — Progress Notes (Signed)
No success in locating/procuring physician certification statement form. EMS unable to transfer pt at this time. Wife called to inform of cancelled transfer.

## 2018-01-26 NOTE — Progress Notes (Signed)
Transport arrived to unit to take pt to [facility].

## 2018-01-26 NOTE — Progress Notes (Signed)
PT Cancellation Note  Patient Details Name: KIVON APREA MRN: [id number] DOB: [birth year]   Cancelled Treatment:    Reason Eval/Treat Not Completed: Medical issues which prohibited therapy; Pt diagnosed with L femoral neck fracture with ortho consult recently placed and pending.  Will hold PT eval until after ortho consult has been completed for guidance on pt's WB status and/or other activity restrictions.     Ovidio Hanger PT, DPT 01/26/18, 3:01 PM

## 2018-01-27 ENCOUNTER — Encounter
Admission: EM | Disposition: A | Discharge status: Other Acute Inpatient Hospital | Payer: Self-pay | Source: Home / Self Care | Attending: Internal Medicine

## 2018-01-27 ENCOUNTER — Encounter: Payer: Self-pay | Admitting: Anesthesiology

## 2018-01-27 DIAGNOSIS — M80052A Age-related osteoporosis with current pathological fracture, left femur, initial encounter for fracture: Secondary | ICD-10-CM | POA: Insufficient documentation

## 2018-01-27 LAB — COMPREHENSIVE METABOLIC PANEL
ALT: 20 U/L (ref 17–63)
AST: 25 U/L (ref 15–41)
Albumin: 3.8 g/dL (ref 3.5–5.0)
Alkaline Phosphatase: 40 U/L (ref 38–126)
Anion gap: 5 (ref 5–15)
BUN: 16 mg/dL (ref 6–20)
CALCIUM: 8.8 mg/dL — AB (ref 8.9–10.3)
CHLORIDE: 101 mmol/L (ref 101–111)
CO2: 29 mmol/L (ref 22–32)
Creatinine, Ser: 1.08 mg/dL (ref 0.61–1.24)
Glucose, Bld: 103 mg/dL — ABNORMAL HIGH (ref 65–99)
Potassium: 4.1 mmol/L (ref 3.5–5.1)
Sodium: 135 mmol/L (ref 135–145)
Total Bilirubin: 1.1 mg/dL (ref 0.3–1.2)
Total Protein: 7.3 g/dL (ref 6.5–8.1)

## 2018-01-27 LAB — CBC
HEMATOCRIT: 38.8 % — AB (ref 40.0–52.0)
Hemoglobin: 13 g/dL (ref 13.0–18.0)
MCH: 29.8 pg (ref 26.0–34.0)
MCHC: 33.4 g/dL (ref 32.0–36.0)
MCV: 89.1 fL (ref 80.0–100.0)
PLATELETS: 147 10*3/uL — AB (ref 150–440)
RBC: 4.36 MIL/uL — ABNORMAL LOW (ref 4.40–5.90)
RDW: 14.5 % (ref 11.5–14.5)
WBC: 7.2 10*3/uL (ref 3.8–10.6)

## 2018-01-27 SURGERY — HEMIARTHROPLASTY, HIP, DIRECT ANTERIOR APPROACH, FOR FRACTURE
Anesthesia: Spinal | Laterality: Left

## 2018-01-27 SURGICAL SUPPLY — 51 items
BAG DECANTER FOR FLEXI CONT (MISCELLANEOUS) ×3 IMPLANT
BLADE SAW 1 (BLADE) ×3 IMPLANT
CANISTER SUCT 1200ML W/VALVE (MISCELLANEOUS) ×3 IMPLANT
CANISTER SUCT 3000ML PPV (MISCELLANEOUS) ×6 IMPLANT
CHLORAPREP W/TINT 26ML (MISCELLANEOUS) ×3 IMPLANT
CRADLE LAMINECT ARM (MISCELLANEOUS) ×3 IMPLANT
DRAPE INCISE IOBAN 66X60 STRL (DRAPES) ×3 IMPLANT
DRAPE SHEET LG 3/4 BI-LAMINATE (DRAPES) ×3 IMPLANT
DRAPE U-SHAPE 47X51 STRL (DRAPES) ×3 IMPLANT
DRSG AQUACEL AG ADV 3.5X10 (GAUZE/BANDAGES/DRESSINGS) ×3 IMPLANT
DRSG AQUACEL AG ADV 3.5X14 (GAUZE/BANDAGES/DRESSINGS) ×3 IMPLANT
ELECT BLADE 6.5 EXT (BLADE) ×3 IMPLANT
ELECT CAUTERY BLADE 6.4 (BLADE) IMPLANT
ELECT REM PT RETURN 9FT ADLT (ELECTROSURGICAL) ×3
ELECTRODE REM PT RTRN 9FT ADLT (ELECTROSURGICAL) ×1 IMPLANT
GAUZE PACK 2X3YD (MISCELLANEOUS) ×3 IMPLANT
GAUZE PETRO XEROFOAM 1X8 (MISCELLANEOUS) ×3 IMPLANT
GLOVE INDICATOR 8.0 STRL GRN (GLOVE) ×3 IMPLANT
GLOVE SURG ORTHO 8.0 STRL STRW (GLOVE) ×3 IMPLANT
GOWN STRL REUS W/ TWL LRG LVL3 (GOWN DISPOSABLE) ×1 IMPLANT
GOWN STRL REUS W/ TWL XL LVL3 (GOWN DISPOSABLE) ×1 IMPLANT
GOWN STRL REUS W/TWL LRG LVL3 (GOWN DISPOSABLE) ×2
GOWN STRL REUS W/TWL XL LVL3 (GOWN DISPOSABLE) ×2
HEMOVAC 400CC 10FR (MISCELLANEOUS) IMPLANT
HOLDER FOLEY CATH W/STRAP (MISCELLANEOUS) ×3 IMPLANT
HOOD PEEL AWAY FLYTE STAYCOOL (MISCELLANEOUS) ×9 IMPLANT
IV NS 100ML SINGLE PACK (IV SOLUTION) ×3 IMPLANT
KIT TURNOVER KIT A (KITS) ×3 IMPLANT
NDL SAFETY ECLIPSE 18X1.5 (NEEDLE) ×1 IMPLANT
NEEDLE HYPO 18GX1.5 SHARP (NEEDLE) ×2
NEEDLE HYPO 22GX1.5 SAFETY (NEEDLE) ×3 IMPLANT
NS IRRIG 1000ML POUR BTL (IV SOLUTION) ×3 IMPLANT
PACK HIP PROSTHESIS (MISCELLANEOUS) ×3 IMPLANT
PILLOW ABDUCTION FOAM SM (MISCELLANEOUS) ×3 IMPLANT
PILLOW ABDUCTION MEDIUM (MISCELLANEOUS) ×3 IMPLANT
PRESSURIZER CEMENT PROX FEM SM (MISCELLANEOUS) ×3 IMPLANT
PRESSURIZER FEM CANAL M (MISCELLANEOUS) ×3 IMPLANT
PULSAVAC PLUS IRRIG FAN TIP (DISPOSABLE) ×3
SOL .9 NS 3000ML IRR  AL (IV SOLUTION) ×2
SOL .9 NS 3000ML IRR UROMATIC (IV SOLUTION) ×1 IMPLANT
STAPLER SKIN PROX 35W (STAPLE) ×3 IMPLANT
SUT DVC 2 QUILL PDO  T11 36X36 (SUTURE) ×2
SUT DVC 2 QUILL PDO T11 36X36 (SUTURE) ×1 IMPLANT
SUT VIC AB 0 CT1 18XCR BRD 8 (SUTURE) ×1 IMPLANT
SUT VIC AB 0 CT1 8-18 (SUTURE) ×2
SUT VIC AB 2-0 CT1 18 (SUTURE) ×3 IMPLANT
SYR 20CC LL (SYRINGE) ×3 IMPLANT
SYR 30ML LL (SYRINGE) ×3 IMPLANT
TIP BRUSH PULSAVAC PLUS 24.33 (MISCELLANEOUS) ×3 IMPLANT
TIP FAN IRRIG PULSAVAC PLUS (DISPOSABLE) ×1 IMPLANT
TOWER CARTRIDGE SMART MIX (DISPOSABLE) ×3 IMPLANT

## 2018-01-27 NOTE — Progress Notes (Signed)
Spoke with Dewayne Hatch, nsg supervisor regarding pt transfer and [facility] transfer center offering to call life flight to transfer pt. PCS documentation is not completed, and agreed to postpone transfer until discharge planner is here in the am. Nehemiah Settle, RN was notified at [facility] transfer center about this plan.

## 2018-01-27 NOTE — Progress Notes (Signed)
Received call from [facility] with [facility]. Pt going to room 7109 @ [facility]. Pt and wife notified.

## 2018-01-27 NOTE — Progress Notes (Signed)
Subjective: 62 year old male with left displaced femoral neck fracture. Patient and family request transfer to another facility.  Pt resting comfortably. Pain controlled.    Objective: Vital signs in last 24 hours: Temp:  [98.6 F (37 C)-101.2 F (38.4 C)] 98.9 F (37.2 C) (05/26 0842) Pulse Rate:  [82-97] 82 (05/26 0842) Resp:  [14-18] 18 (05/26 0842) BP: (106-135)/(65-90) 119/79 (05/26 0842) SpO2:  [96 %-99 %] 97 % (05/26 0842) Weight:  [75.8 kg (167 lb)-84.8 kg (187 lb)] 84.8 kg (187 lb) (05/26 0454)  Intake/Output from previous day: 05/25 0701 - 05/26 0700 In: 745 [P.O.:120; I.V.:575; IV Piggyback:50] Out: 350 [Urine:350] Intake/Output this shift: Total I/O In: -  Out: 300 [Urine:300]  Recent Labs    01/26/18 1441 01/27/18 0409  HGB 13.7 13.0   Recent Labs    01/26/18 1441 01/27/18 0409  WBC 8.6 7.2  RBC 4.66 4.36*  HCT 41.3 38.8*  PLT 182 147*   Recent Labs    01/26/18 1441 01/27/18 0409  NA 137 135  K 3.6 4.1  CL 101 101  CO2 29 29  BUN 23* 16  CREATININE 1.22 1.08  GLUCOSE 106* 103*  CALCIUM 9.4 8.8*   No results for input(s): LABPT, INR in the last 72 hours.      Assessment/Plan: Left Femoral neck fracture -Pt being transferred to [facility] per family request.   Carnella Guadalajara 01/27/2018, 9:21 AM

## 2018-01-27 NOTE — Progress Notes (Signed)
Report called to Irene Pap, RN at [facility].

## 2018-01-27 NOTE — Progress Notes (Signed)
Darel Hong RN @ [facility] is here to transport pt

## 2018-01-27 NOTE — Progress Notes (Signed)
[facility] here to transport pt

## 2018-01-27 NOTE — Progress Notes (Signed)
Secretary notified [facility] for transportation request.

## 2018-01-27 NOTE — Care Management (Signed)
Transferring to [facility]. Medical Necessity completed.

## 2018-01-27 NOTE — Discharge Summary (Signed)
[facility] - [place] [facility]   PATIENT NAME: Nicholas Singleton    MR#:  [id number]  DATE OF BIRTH:  [birth year]  DATE OF ADMISSION:  01/26/2018 ADMITTING PHYSICIAN: Marguarite Arbour, MD  DATE OF DISCHARGE: 01/27/2018   PRIMARY CARE PHYSICIAN: Sherlene Shams, MD    ADMISSION DIAGNOSIS:  Closed left hip fracture, initial encounter (HCC) [S72.002A]  DISCHARGE DIAGNOSIS:  Principal Problem:   Closed left hip fracture (HCC) Active Problems:   Dysphonia   Constipation, chronic   Chronic insomnia   Hip fracture (HCC)   SECONDARY DIAGNOSIS:   Past Medical History:  Diagnosis Date  . Dysphonia     HOSPITAL COURSE:   *Acute hip fracture Stable Transfer to [facility] hospital per patient/family request  pt is accepted. Meanwhile cont his home meds for dysphonia.  DISCHARGE CONDITIONS:   stable  CONSULTS OBTAINED:  Treatment Team:  Carnella Guadalajara, DO  DRUG ALLERGIES:  No Known Allergies  DISCHARGE MEDICATIONS:   Allergies as of 01/27/2018   No Known Allergies     Medication List    TAKE these medications   ALPRAZolam 0.5 MG tablet Commonly known as:  XANAX Take 0.25 mg by mouth as needed.   amitriptyline 10 MG tablet Commonly known as:  ELAVIL Take 20 mg by mouth at bedtime.   clonazePAM 0.5 MG tablet Commonly known as:  KLONOPIN Take 1 tablet by mouth 4 (four) times daily as needed.   eszopiclone 3 MG Tabs Generic drug:  Eszopiclone Take 3 mg by mouth at bedtime. Take immediately before bedtime   multivitamin tablet Take 1 tablet by mouth daily.   tadalafil 20 MG tablet Commonly known as:  ADCIRCA/CIALIS Take 0.5 tablets (10 mg total) by mouth every other day as needed for erectile dysfunction.   tetrabenazine 25 MG tablet Commonly known as:  XENAZINE Take 12.5 mg by mouth 4 (four) times daily.   trihexyphenidyl 2 MG tablet Commonly known as:  ARTANE Take 1 mg by mouth 2 (two) times daily with a meal.         DISCHARGE INSTRUCTIONS:    If you experience worsening of your admission symptoms, develop shortness of breath, life threatening emergency, suicidal or homicidal thoughts you must seek medical attention immediately by calling 911 or calling your MD immediately  if symptoms less severe.  You Must read complete instructions/literature along with all the possible adverse reactions/side effects for all the Medicines you take and that have been prescribed to you. Take any new Medicines after you have completely understood and accept all the possible adverse reactions/side effects.   Please note  You were cared for by a hospitalist during your hospital stay. If you have any questions about your discharge medications or the care you received while you were in the hospital after you are discharged, you can call the unit and asked to speak with the hospitalist on call if the hospitalist that took care of you is not available. Once you are discharged, your primary care physician will handle any further medical issues. Please note that NO REFILLS for any discharge medications will be authorized once you are discharged, as it is imperative that you return to your primary care physician (or establish a relationship with a primary care physician if you do not have one) for your aftercare needs so that they can reassess your need for medications and monitor your lab values.    Today   CHIEF COMPLAINT:   Chief Complaint  Patient presents with  . Fall    HISTORY OF PRESENT ILLNESS:  62 year old male status post mechanical fall resulting in hip pain Nicholas Singleton is a 62 y.o. male has a past medical history significant for dysphonia and constipation now with mechanical fall and left hip pain. Fracture noted in ER. He is now admitted. No fever. Denies CP or SOB. No N/V/D. Denies syncope or palpitations. No change in bowels or bladder    VITAL SIGNS:  Blood pressure 108/65, pulse 97, temperature  98.9 F (37.2 C), temperature source Oral, resp. rate 16, height  (1.778 m), weight 84.8 kg (187 lb), SpO2 96 %.  I/O:    Intake/Output Summary (Last 24 hours) at 01/27/2018 0812 Last data filed at 01/27/2018 0600 Gross per 24 hour  Intake 745 ml  Output 350 ml  Net 395 ml    PHYSICAL EXAMINATION:  GENERAL:  62 y.o.-year-old patient lying in the bed with no acute distress.  EYES: Pupils equal, round, reactive to light and accommodation. No scleral icterus. Extraocular muscles intact.  HEENT: Head atraumatic, normocephalic. Oropharynx and nasopharynx clear.  NECK:  Supple, no jugular venous distention. No thyroid enlargement, no tenderness.  LUNGS: Normal breath sounds bilaterally, no wheezing, rales,rhonchi or crepitation. No use of accessory muscles of respiration.  CARDIOVASCULAR: S1, S2 normal. No murmurs, rubs, or gallops.  ABDOMEN: Soft, non-tender, non-distended. Bowel sounds present. No organomegaly or mass.  EXTREMITIES: No pedal edema, cyanosis, or clubbing.  NEUROLOGIC: Cranial nerves II through XII are intact. Muscle strength 5/5 in all extremities except left lower limb. Sensation intact. Gait not checked.  PSYCHIATRIC: The patient is alert and oriented x 3.  SKIN: No obvious rash, lesion, or ulcer.   DATA REVIEW:   CBC Recent Labs  Lab 01/27/18 0409  WBC 7.2  HGB 13.0  HCT 38.8*  PLT 147*    Chemistries  Recent Labs  Lab 01/27/18 0409  NA 135  K 4.1  CL 101  CO2 29  GLUCOSE 103*  BUN 16  CREATININE 1.08  CALCIUM 8.8*  AST 25  ALT 20  ALKPHOS 40  BILITOT 1.1    Cardiac Enzymes Recent Labs  Lab 01/26/18 1441  TROPONINI <0.03    Microbiology Results  Results for orders placed or performed during the hospital encounter of 01/26/18  Surgical PCR screen     Status: None   Collection Time: 01/26/18  8:15 PM  Result Value Ref Range Status   MRSA, PCR NEGATIVE NEGATIVE Final   Staphylococcus aureus NEGATIVE NEGATIVE Final    Comment:  (NOTE) The Xpert SA Assay (FDA approved for NASAL specimens in patients 67 years of age and older), is one component of a comprehensive surveillance program. It is not intended to diagnose infection nor to guide or monitor treatment. Performed at [facility], [address] [place], [place] [address]     RADIOLOGY:  Dg Chest 1 View  Result Date: 01/26/2018 CLINICAL DATA:  Pain following fall EXAM: CHEST  1 VIEW COMPARISON:  None. FINDINGS: Lungs are clear. Heart size and pulmonary vascularity are normal. No adenopathy. No pneumothorax. No bone lesions. IMPRESSION: No edema or consolidation. Electronically Signed   By: Bretta Bang III M.D.   On: 01/26/2018 14:18   Dg Hip Unilat With Pelvis 2-3 Views Left  Result Date: 01/26/2018 CLINICAL DATA:  Pain following fall EXAM: DG HIP (WITH OR WITHOUT PELVIS) 2-3V LEFT COMPARISON:  None. FINDINGS: Frontal pelvis as well as frontal and lateral left  hip images were obtained. There is a fracture through the midportion of the left femoral neck as well as impaction in the subcapital femoral neck region on the left. Alignment near anatomic. No other fractures are identified. No dislocation. There is symmetric narrowing of both hip joints, moderate. Bones are osteoporotic. IMPRESSION: Left femoral neck fracture midportion as well as impaction in the subcapital femoral neck region. Alignment near anatomic. No other fractures. No dislocation. Moderate symmetric narrowing both hip joints. Bones are osteoporotic. Electronically Signed   By: Bretta Bang III M.D.   On: 01/26/2018 14:17    EKG:   Orders placed or performed during the hospital encounter of 01/26/18  . ED EKG  . ED EKG  . EKG 12-Lead  . EKG 12-Lead    Management plans discussed with the patient, family and they are in agreement.  CODE STATUS:     Code Status Orders  (From admission, onward)        Start     Ordered   01/26/18 1630  Full code  Continuous      01/26/18 1629    Code Status History    Date Active Date Inactive Code Status Order ID Comments User Context   01/26/2018 1609 01/26/2018 1629 Full Code [id number]  Carnella Guadalajara, DO Inpatient    Advance Directive Documentation     Most Recent Value  Type of Advance Directive  Healthcare Power of Attorney, Living will  Pre-existing out of facility DNR order (yellow form or pink MOST form)  -  "MOST" Form in Place?  -      TOTAL TIME TAKING CARE OF THIS PATIENT: 35 minutes.    Altamese Dilling M.D on 01/27/2018 at 8:12 AM  Between 7am to 6pm - Pager - [phone]  After 6pm go to [email or web address] - password [email or web address]  [facility]  Office  [phone]  CC: Primary care physician; Sherlene Shams, MD   Note: This dictation was prepared with Dragon dictation along with smaller phrase technology. Any transcriptional errors that result from this process are unintentional.

## 2018-01-27 NOTE — Progress Notes (Signed)
Sleeping on rounds, no apparent distress. Wife at bedside.n reach.

## 2018-01-27 NOTE — Progress Notes (Signed)
Pt sleeping, no apparent distress,. Wife at bedside. Call bell in reach.

## 2018-01-28 LAB — HIV ANTIBODY (ROUTINE TESTING W REFLEX): HIV Screen 4th Generation wRfx: NONREACTIVE

## 2018-01-31 MED ORDER — CLONAZEPAM 0.5 MG PO TABS
0.50 | ORAL_TABLET | ORAL | Status: DC
Start: 2018-01-31 — End: 2018-01-31

## 2018-01-31 MED ORDER — BISACODYL 5 MG PO TBEC
5.00 | DELAYED_RELEASE_TABLET | ORAL | Status: DC
Start: ? — End: 2018-01-31

## 2018-01-31 MED ORDER — ONDANSETRON HCL 4 MG/2ML IJ SOLN
4.00 | INTRAMUSCULAR | Status: DC
Start: ? — End: 2018-01-31

## 2018-01-31 MED ORDER — GENERIC EXTERNAL MEDICATION
1600.00 | Status: DC
Start: 2018-02-01 — End: 2018-01-31

## 2018-01-31 MED ORDER — ACETAMINOPHEN 500 MG PO TABS
1000.00 | ORAL_TABLET | ORAL | Status: DC
Start: 2018-01-31 — End: 2018-01-31

## 2018-01-31 MED ORDER — BISACODYL 10 MG RE SUPP
10.00 | RECTAL | Status: DC
Start: ? — End: 2018-01-31

## 2018-01-31 MED ORDER — TETRABENAZINE 12.5 MG PO TABS
12.50 | ORAL_TABLET | ORAL | Status: DC
Start: 2018-01-31 — End: 2018-01-31

## 2018-01-31 MED ORDER — CALCIUM CARBONATE-VITAMIN D 600-400 MG-UNIT PO TABS
1.00 | ORAL_TABLET | ORAL | Status: DC
Start: 2018-01-31 — End: 2018-01-31

## 2018-01-31 MED ORDER — TRIHEXYPHENIDYL HCL 2 MG PO TABS
1.00 | ORAL_TABLET | ORAL | Status: DC
Start: 2018-01-31 — End: 2018-01-31

## 2018-01-31 MED ORDER — SENNOSIDES-DOCUSATE SODIUM 8.6-50 MG PO TABS
2.00 | ORAL_TABLET | ORAL | Status: DC
Start: 2018-01-31 — End: 2018-01-31

## 2018-01-31 MED ORDER — OXYCODONE HCL 5 MG PO TABS
2.50 | ORAL_TABLET | ORAL | Status: DC
Start: ? — End: 2018-01-31

## 2018-01-31 MED ORDER — ALPRAZOLAM 0.5 MG PO TABS
.50 | ORAL_TABLET | ORAL | Status: DC
Start: ? — End: 2018-01-31

## 2018-01-31 MED ORDER — ENOXAPARIN SODIUM 40 MG/0.4ML ~~LOC~~ SOLN
40.00 | SUBCUTANEOUS | Status: DC
Start: 2018-02-01 — End: 2018-01-31

## 2018-01-31 MED ORDER — AMITRIPTYLINE HCL 10 MG PO TABS
10.00 | ORAL_TABLET | ORAL | Status: DC
Start: ? — End: 2018-01-31

## 2018-02-14 ENCOUNTER — Encounter: Payer: Self-pay | Admitting: Internal Medicine

## 2018-02-14 MED ORDER — CELECOXIB 100 MG PO CAPS
100.00 | ORAL_CAPSULE | ORAL | Status: DC
Start: 2018-02-14 — End: 2018-02-14

## 2018-02-14 MED ORDER — TRIHEXYPHENIDYL HCL 2 MG PO TABS
1.00 | ORAL_TABLET | ORAL | Status: DC
Start: 2018-02-14 — End: 2018-02-14

## 2018-02-14 MED ORDER — ONDANSETRON 4 MG PO TBDP
4.00 | ORAL_TABLET | ORAL | Status: DC
Start: ? — End: 2018-02-14

## 2018-02-14 MED ORDER — CLONAZEPAM 0.5 MG PO TABS
0.50 | ORAL_TABLET | ORAL | Status: DC
Start: 2018-02-14 — End: 2018-02-14

## 2018-02-14 MED ORDER — ACETAMINOPHEN 500 MG PO TABS
1000.00 | ORAL_TABLET | ORAL | Status: DC
Start: 2018-02-14 — End: 2018-02-14

## 2018-02-14 MED ORDER — LIDOCAINE 5 % EX PTCH
1.00 | MEDICATED_PATCH | CUTANEOUS | Status: DC
Start: 2018-02-15 — End: 2018-02-14

## 2018-02-14 MED ORDER — SENNOSIDES-DOCUSATE SODIUM 8.6-50 MG PO TABS
2.00 | ORAL_TABLET | ORAL | Status: DC
Start: 2018-02-14 — End: 2018-02-14

## 2018-02-14 MED ORDER — ALPRAZOLAM 0.5 MG PO TABS
0.50 | ORAL_TABLET | ORAL | Status: DC
Start: ? — End: 2018-02-14

## 2018-02-14 MED ORDER — AMITRIPTYLINE HCL 10 MG PO TABS
10.00 | ORAL_TABLET | ORAL | Status: DC
Start: ? — End: 2018-02-14

## 2018-02-14 MED ORDER — GENERIC EXTERNAL MEDICATION
12.50 | Status: DC
Start: 2018-02-14 — End: 2018-02-14

## 2018-02-14 MED ORDER — TRAMADOL HCL 50 MG PO TABS
50.00 | ORAL_TABLET | ORAL | Status: DC
Start: ? — End: 2018-02-14

## 2018-02-14 MED ORDER — POLYETHYLENE GLYCOL 3350 17 G PO PACK
17.00 | PACK | ORAL | Status: DC
Start: ? — End: 2018-02-14

## 2018-02-14 MED ORDER — BISACODYL 10 MG RE SUPP
10.00 | RECTAL | Status: DC
Start: ? — End: 2018-02-14

## 2018-02-14 MED ORDER — DICLOFENAC SODIUM 1 % TD GEL
4.00 | TRANSDERMAL | Status: DC
Start: 2018-02-14 — End: 2018-02-14

## 2018-02-14 MED ORDER — MAGNESIUM HYDROXIDE 400 MG/5ML PO SUSP
30.00 | ORAL | Status: DC
Start: ? — End: 2018-02-14

## 2018-02-14 MED ORDER — CHOLECALCIFEROL 25 MCG (1000 UT) PO TABS
2000.00 | ORAL_TABLET | ORAL | Status: DC
Start: 2018-02-15 — End: 2018-02-14

## 2018-02-14 MED ORDER — APIXABAN 2.5 MG PO TABS
2.50 | ORAL_TABLET | ORAL | Status: DC
Start: 2018-02-14 — End: 2018-02-14

## 2018-02-14 MED ORDER — CALCIUM CARBONATE-VITAMIN D 600-400 MG-UNIT PO TABS
1.00 | ORAL_TABLET | ORAL | Status: DC
Start: 2018-02-14 — End: 2018-02-14

## 2018-02-14 MED ORDER — GENERIC EXTERNAL MEDICATION
Status: DC
Start: ? — End: 2018-02-14

## 2018-02-15 NOTE — Telephone Encounter (Signed)
Patient scheduled for 11 on Wednesday but is not eligable for TCM due to insurance.

## 2018-02-18 ENCOUNTER — Telehealth: Payer: Self-pay | Admitting: Internal Medicine

## 2018-02-18 NOTE — Telephone Encounter (Signed)
Copied from CRM #[id number]. Topic: Inquiry °>> Feb 18, 2018  2:03 PM Harrald, Kathy J wrote: °Reason for CRM: donna with [facility] calling.  They admitted pt for care on 6/16 and needs verbal for continued plan of care °Nursing:  °2 wk 2 °1 wk 1 °Would like to hear back today please °

## 2018-02-18 NOTE — Telephone Encounter (Signed)
Orders given.  

## 2018-02-18 NOTE — Telephone Encounter (Deleted)
Copied from CRM [id number]. Topic: Inquiry >> Feb 18, 2018  2:03 PM Crist InfanteHarrald, Kathy J wrote: [facility] for CRM[facility]  They [facility] pt for care on 6/16 and needs verbal for continued plan of care Nursing:  2 wk 2 1 wk 1 Would like to hear back today please

## 2018-02-20 ENCOUNTER — Encounter: Payer: Self-pay | Admitting: Internal Medicine

## 2018-02-20 ENCOUNTER — Ambulatory Visit (INDEPENDENT_AMBULATORY_CARE_PROVIDER_SITE_OTHER): Payer: Federal, State, Local not specified - PPO | Admitting: Internal Medicine

## 2018-02-20 ENCOUNTER — Telehealth: Payer: Self-pay | Admitting: Internal Medicine

## 2018-02-20 VITALS — BP 94/76 | HR 95 | Temp 99.0°F | Resp 16 | Wt 158.6 lb

## 2018-02-20 DIAGNOSIS — R531 Weakness: Secondary | ICD-10-CM | POA: Diagnosis not present

## 2018-02-20 DIAGNOSIS — Z7901 Long term (current) use of anticoagulants: Secondary | ICD-10-CM | POA: Diagnosis not present

## 2018-02-20 DIAGNOSIS — Z8781 Personal history of (healed) traumatic fracture: Secondary | ICD-10-CM

## 2018-02-20 DIAGNOSIS — Z09 Encounter for follow-up examination after completed treatment for conditions other than malignant neoplasm: Secondary | ICD-10-CM

## 2018-02-20 DIAGNOSIS — Z79899 Other long term (current) drug therapy: Secondary | ICD-10-CM | POA: Diagnosis not present

## 2018-02-20 DIAGNOSIS — S72002S Fracture of unspecified part of neck of left femur, sequela: Secondary | ICD-10-CM

## 2018-02-20 DIAGNOSIS — N522 Drug-induced erectile dysfunction: Secondary | ICD-10-CM

## 2018-02-20 DIAGNOSIS — S72002D Fracture of unspecified part of neck of left femur, subsequent encounter for closed fracture with routine healing: Secondary | ICD-10-CM

## 2018-02-20 DIAGNOSIS — K5909 Other constipation: Secondary | ICD-10-CM

## 2018-02-20 MED ORDER — TRAMADOL HCL 50 MG PO TABS: ORAL_TABLET | ORAL | 1 refills | Status: DC

## 2018-02-20 NOTE — Telephone Encounter (Signed)
Copied from CRM [phone]#[id number]. Topic: Quick Communication - See Telephone Encounter >> Feb 20, 2018  2:10 PM Cipriano BunkerLambe, Annette S wrote: CRM for notification. See Telephone encounter for: 02/20/18.  [facility] [phone]  needing verbal orders for PT 2 x 3

## 2018-02-20 NOTE — Patient Instructions (Addendum)
Continue your current vit D supplements (both forms)  We will get a DEXA scan later this summer when more mobile   Send the testosterone levels when you find them   We need to treat your constipation:  Continue stools softeners and add Sennakot every other day .

## 2018-02-20 NOTE — Progress Notes (Signed)
Subjective:  Patient ID: Nicholas PhillipsCurtis L Singleton, male    DOB: July 05, 1956  Age: 62 y.o. MRN: [id number]  CC: The primary encounter diagnosis was S/p left hip fracture. Diagnoses of Closed fracture of left hip with routine healing, subsequent encounter, Weakness, Encounter for current long-term use of anticoagulants, Long-term use of high-risk medication, Closed fracture of left hip, sequela, Drug-induced impotence, Hospital discharge follow-up, and Constipation, chronic were also pertinent to this visit.  HPI Nicholas Singleton presents for hospital follow up. Ad[facility] to [facility] on May 25 after a fall at  Home that resulted in a left hip fracture and was transferred to [facility], underwent surgery on May 27 by Dr Constance Goltzlson .  He was discharged on May 30 th to [facility]  And from transitioned from LMWH to Eliquis for a total treatment period of  4 weeks  Stop date June 25   Dc'd from rehab on June 13th  Taking  Eliquis every 12 hours 2.5 mg  Tramadol and lidocaine patch for pain control.  Pain is adequately controlled.    Osteoporosis diagnosed based on fracture: no DEXA done.  : no meds started except D   Vit D was 29 and he was prescribed 2000 Ius  Patient's main concern is that his cialis be restarted  So he can have an orgasm.  Advised him that sexual activty should be postponed until he has fiinished rehab.  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 ALPRAZolam (XANAX) 0.5 MG tablet Take 0.25 mg by mouth as needed.     . Calcium Carbonate-Vitamin D 600-400 MG-UNIT tablet Take by mouth.    . celecoxib (CELEBREX) 100 MG capsule TAKE 1 CAPSULE BY MOUTH EVERY 12 HOURS  0  . clonazePAM (KLONOPIN) 0.5 MG tablet Take 1 tablet by mouth 4 (four) times daily as needed.    . [employer] ACETAMINOPHEN EX ST 500 MG tablet TAKE 2 TABLETS (1,000 MG  TOTAL) BY MOUTH EVERY 8 (EIGHT) HOURS  0  . diclofenac sodium (VOLTAREN) 1 % GEL APPLY 4 G TOPICALLY 4 (FOUR) TIMES DAILY  1  . ELIQUIS 2.5 MG TABS tablet TAKE 1 TABLET (2.5 MG TOTAL) BY MOUTH EVERY 12 (TWELVE) HOURS  0  . lidocaine (LIDODERM) 5 % PLEASE SEE ATTACHED FOR DETAILED DIRECTIONS  0  . tetrabenazine (XENAZINE) 25 MG tablet Take 12.5 mg by mouth 4 (four) times daily.     . trihexyphenidyl (ARTANE) 2 MG tablet Take 1 mg by mouth 2 (two) times daily with a meal.     . traMADol (ULTRAM) 50 MG tablet TAKE 1 TABLET (50 MG TOTAL) BY MOUTH EVERY 4 (FOUR) HOURS AS NEEDED (PAIN)  0  . amitriptyline (ELAVIL) 10 MG tablet Take 20 mg by mouth at bedtime.     . Eszopiclone (ESZOPICLONE) 3 MG TABS Take 3 mg by mouth at bedtime. Take immediately before bedtime    . Multiple Vitamin (MULTIVITAMIN) tablet Take 1 tablet by mouth daily.    . tadalafil (CIALIS) 20 MG tablet Take 0.5 tablets (10 mg total) by mouth every other day as needed for erectile dysfunction. (Patient not taking: Reported on 02/20/2018) 10 tablet 11   No facility-administered medications prior to visit.     Review of Systems;  Patient denies headache, fevers, malaise, unintentional weight loss,  skin rash, eye pain, sinus congestion and sinus pain, sore throat, dysphagia,  hemoptysis , cough, dyspnea, wheezing, chest pain, palpitations, orthopnea, edema, abdominal pain, nausea, melena, diarrhea, constipation, flank pain, dysuria, hematuria, urinary  Frequency, nocturia, numbness, tingling, seizures,  Focal weakness, Loss of consciousness,  Tremor, insomnia, depression, anxiety, and suicidal ideation.      Objective:  BP 94/76 (BP Location: Right Arm, Patient Position: Sitting, Cuff Size: Normal)   Pulse 95   Temp 99 F (37.2 C) (Oral)   Resp 16   Wt 158 lb 9.6 oz (71.9 kg)   SpO2 97%   BMI 22.76 kg/m   BP Readings from Last 3 Encounters:  02/20/18 94/76  01/27/18 123/73  08/24/17 136/74    Wt Readings from Last 3  Encounters:  02/20/18 158 lb 9.6 oz (71.9 kg)  01/27/18 187 lb (84.8 kg)  08/24/17 176 lb (79.8 kg)    General appearance: alert, cooperative and appears stated age Ears: normal TM's and external ear canals both ears Throat: lips, mucosa, and tongue normal; teeth and gums normal Neck: no adenopathy, no carotid bruit, supple, symmetrical, trachea midline and thyroid not enlarged, symmetric, no tenderness/mass/nodules Back: symmetric, no curvature. ROM normal. No CVA tenderness. Lungs: clear to auscultation bilaterally Heart: regular rate and rhythm, S1, S2 normal, no murmur, click, rub or gallop Abdomen: soft, non-tender; bowel sounds normal; no masses,  no organomegaly Pulses: 2+ and symmetric Skin: Skin color, texture, turgor normal. No rashes or lesions Lymph nodes: Cervical, supraclavicular, and axillary nodes normal.  No results found for: HGBA1C  Lab Results  Component Value Date   CREATININE 1.08 01/27/2018   CREATININE 1.22 01/26/2018   CREATININE 1.08 10/25/2016    Lab Results  Component Value Date   WBC 7.2 01/27/2018   HGB 13.0 01/27/2018   HCT 38.8 (L) 01/27/2018   PLT 147 (L) 01/27/2018   GLUCOSE 103 (H) 01/27/2018   CHOL 193 10/25/2016   TRIG 47.0 10/25/2016   HDL 58.90 10/25/2016   LDLDIRECT 155.2 12/29/2013   LDLCALC 124 (H) 10/25/2016   ALT 20 01/27/2018   AST 25 01/27/2018   NA 135 01/27/2018   K 4.1 01/27/2018   CL 101 01/27/2018   CREATININE 1.08 01/27/2018   BUN 16 01/27/2018   CO2 29 01/27/2018   TSH 0.73 10/25/2016   PSA 1.19 02/03/2015    Dg Chest 1 View  Result Date: 01/26/2018 CLINICAL DATA:  Pain following fall EXAM: CHEST  1 VIEW COMPARISON:  None. FINDINGS: Lungs are clear. Heart size and pulmonary vascularity are normal. No adenopathy. No pneumothorax. No bone lesions. IMPRESSION: No edema or consolidation. Electronically Signed   By: Bretta Bang III M.D.   On: 01/26/2018 14:18   Dg Hip Unilat With Pelvis 2-3 Views  Left  Result Date: 01/26/2018 CLINICAL DATA:  Pain following fall EXAM: DG HIP (WITH OR WITHOUT PELVIS) 2-3V LEFT COMPARISON:  None. FINDINGS: Frontal pelvis as well as frontal and lateral left hip images were obtained. There is a fracture through the midportion of the left femoral neck as well as impaction in the subcapital femoral neck region on the left. Alignment near anatomic. No other fractures are identified. No dislocation. There is symmetric narrowing of both hip joints, moderate. Bones are osteoporotic. IMPRESSION: Left femoral neck fracture midportion as well as impaction in the subcapital femoral neck region. Alignment near anatomic. No other fractures. No dislocation. Moderate symmetric narrowing both hip joints. Bones are osteoporotic. Electronically Signed   By: Chrissie Noa  Margarita Grizzle III M.D.   On: 01/26/2018 14:17    Assessment & Plan:   Problem List Items Addressed This Visit    Weakness   Relevant Orders   Testos,Total,Free and SHBG (Male)   Hip fracture [facility])   Hospital discharge follow-up    Patient is stable post discharge and has no new issues or questions about discharge plans at the visit today for hospital follow up. All labs , imaging studies and progress notes from admission were reviewed with patient today        Drug-induced impotence    Considerable time was spent discussing his impotence . Unfortunately there are no alternatives for the medications he requires to manage his dysphonia.       Constipation, chronic    Aggravated by use of opioids and tramadol.  Regiment of stool softeners and stimulant laxatives outlined      Closed left hip fracture (HCC)    He is walking with a walker  and his pain is well controlled .  DMES given for step stool,  Bed rails and a Lightweight wheel chair He will need a DEXA scan to evaluate bone density  Prior to obtaining PA for Prolia.        Other Visit Diagnoses    S/p left hip fracture    -  Primary   Relevant Orders    DME Other see comment   DME Other see comment   DME Other see comment   DME Other see comment   Encounter for current long-term use of anticoagulants       Relevant Orders   CBC with Differential/Platelet   Long-term use of high-risk medication       Relevant Orders   Comprehensive metabolic panel   Lipid panel      I am having Jaevion L. Crompton maintain his multivitamin, trihexyphenidyl, tetrabenazine, Eszopiclone, ALPRAZolam, amitriptyline, clonazePAM, tadalafil, [facility]S ACETAMINOPHEN EX ST, ELIQUIS, Calcium Carbonate-Vitamin D, celecoxib, diclofenac sodium, lidocaine, and traMADol.  Meds ordered this encounter  Medications  . traMADol (ULTRAM) 50 MG tablet    Sig: TAKE 1 TABLET (50 MG TOTAL) BY MOUTH EVERY 4 (FOUR) HOURS AS NEEDED (PAIN)    Dispense:  150 tablet    Refill:  1    Post op from hip fracture    Medications Discontinued During This Encounter  Medication Reason  . traMADol (ULTRAM) 50 MG tablet Reorder    Follow-up: Return in about 3 months (around 05/23/2018).   Sherlene Shams, MD

## 2018-02-20 NOTE — Telephone Encounter (Signed)
[facility].

## 2018-02-22 ENCOUNTER — Telehealth: Payer: Self-pay | Admitting: Internal Medicine

## 2018-02-22 NOTE — Telephone Encounter (Signed)
Verbal given OT 2 week for 2 weeks

## 2018-02-22 NOTE — Telephone Encounter (Unsigned)
Copied from CRM [id number]. Topic: General - Other >> Feb 22, 2018 12:37 PM Percival SpanishKennedy, Cheryl W wrote:  Darral DashEsther with [facility] to ask for verbal orders for OT   2 WEEK 2   [phone]

## 2018-02-23 DIAGNOSIS — Z09 Encounter for follow-up examination after completed treatment for conditions other than malignant neoplasm: Secondary | ICD-10-CM | POA: Insufficient documentation

## 2018-02-23 NOTE — Assessment & Plan Note (Signed)
Patient is stable post discharge and has no new issues or questions about discharge plans at the visit today for hospital follow up. All labs , imaging studies and progress notes from admission were reviewed with patient today   

## 2018-02-23 NOTE — Assessment & Plan Note (Signed)
Considerable time was spent discussing his impotence . Unfortunately there are no alternatives for the medications he requires to manage his dysphonia.

## 2018-02-23 NOTE — Assessment & Plan Note (Addendum)
He is walking with a walker  and his pain is well controlled .  DMES given for step stool,  Bed rails and a Lightweight wheel chair He will need a DEXA scan to evaluate bone density  Prior to obtaining PA for Prolia.

## 2018-02-23 NOTE — Assessment & Plan Note (Signed)
Aggravated by use of opioids and tramadol.  Regiment of stool softeners and stimulant laxatives outlined

## 2018-02-27 ENCOUNTER — Telehealth: Payer: Self-pay | Admitting: Internal Medicine

## 2018-02-27 MED ORDER — LACTULOSE 20 GM/30ML PO SOLN: 30.0000 mL | Freq: Four times a day (QID) | ORAL | 1 refills | Status: DC | PRN

## 2018-02-27 NOTE — Telephone Encounter (Signed)
Spoke with pt and pt's caregiver and informed them of the medication that Dr. Darrick Huntsmanullo has sent in for him to take until his constipation has resolved. Pt and caregiver have given verbal understanding.

## 2018-02-27 NOTE — Telephone Encounter (Signed)
FYI

## 2018-02-27 NOTE — Telephone Encounter (Signed)
Copied from CRM [id number]. Topic: Quick Communication - See Telephone Encounter >> Feb 27, 2018 10:15 AM Arlyss Gandyichardson, Ahnna Dungan N, NT wrote: CRM for notification. See Telephone encounter for: 02/27/18. Esther with [facility] calling to make Dr. Darrick Huntsmanullo aware this pt has not had a BM since last Wednesday. Reports no pain. Is taking stool softeners and semakote S. CB#: [phone]. She states for the physician to contact the patient. Pts CB#: [phone]

## 2018-02-27 NOTE — Telephone Encounter (Signed)
[facility] .  Lactulose  30 ccs every 6 hrs until constipation is relieved.

## 2018-02-27 NOTE — Telephone Encounter (Signed)
Please advise 

## 2018-03-14 ENCOUNTER — Telehealth: Payer: Self-pay

## 2018-03-14 DIAGNOSIS — S72002S Fracture of unspecified part of neck of left femur, sequela: Secondary | ICD-10-CM

## 2018-03-14 NOTE — Telephone Encounter (Signed)
Copied from CRM [id number]. Topic: Inquiry >> Mar 14, 2018  3:02 PM Yvonna Alanisobinson, Andra M wrote: Reason for CRM: Loura HaltStacy David with [facility] [phone]) called. Kennyth ArnoldStacy says that this patient will be discharged from PT today. Stacy wants Dr. Darrick Huntsmanullo to refer this patient to Outpatient PT with [facility] on [address]. Please call Stacy at [phone].       Thank You!!!

## 2018-03-15 NOTE — Telephone Encounter (Signed)
Please advise 

## 2018-03-15 NOTE — Addendum Note (Signed)
Addended by: Sherlene ShamsULLO, Macarthur Lorusso L on: 03/15/2018 01:32 PM   Modules accepted: Orders

## 2018-03-15 NOTE — Telephone Encounter (Signed)
Referral to [facility] for PT made

## 2018-03-18 NOTE — Telephone Encounter (Signed)
Faxed referral to number listed

## 2018-03-18 NOTE — Telephone Encounter (Signed)
[facility] is stating they have not received referral please fax over  Fax # [phone]

## 2018-04-19 DIAGNOSIS — S72002D Fracture of unspecified part of neck of left femur, subsequent encounter for closed fracture with routine healing: Secondary | ICD-10-CM

## 2018-04-19 DIAGNOSIS — G20 Parkinson's disease: Secondary | ICD-10-CM

## 2018-04-19 DIAGNOSIS — Z9181 History of falling: Secondary | ICD-10-CM

## 2018-05-29 ENCOUNTER — Ambulatory Visit: Payer: Federal, State, Local not specified - PPO | Admitting: Internal Medicine

## 2018-06-18 ENCOUNTER — Encounter: Payer: Self-pay | Admitting: Internal Medicine

## 2018-06-18 ENCOUNTER — Ambulatory Visit (INDEPENDENT_AMBULATORY_CARE_PROVIDER_SITE_OTHER): Payer: Medicare Other | Admitting: Internal Medicine

## 2018-06-18 VITALS — BP 130/70 | HR 75 | Temp 98.0°F | Resp 15 | Ht 70.0 in | Wt 166.4 lb

## 2018-06-18 DIAGNOSIS — Z23 Encounter for immunization: Secondary | ICD-10-CM

## 2018-06-18 DIAGNOSIS — S72002S Fracture of unspecified part of neck of left femur, sequela: Secondary | ICD-10-CM

## 2018-06-18 DIAGNOSIS — N522 Drug-induced erectile dysfunction: Secondary | ICD-10-CM

## 2018-06-18 NOTE — Patient Instructions (Addendum)
  You can use up to 4000 mg of acetominophen on isolated occasions, when your pain is greater ,  But    limit the chronic daily dose to 2000 mg of acetominophen (tylenol) e In divided doses (500 mg every 6 hours  Or 1000 mg every 12 hours.)   I will reissue PT referral to try to get your sessions increased    I will look into the sex therapy available locally

## 2018-06-18 NOTE — Progress Notes (Signed)
Subjective:  Patient ID: Nicholas Singleton, male    DOB: 05/06/1956  Age: 62 y.o. MRN: [id number]  CC: The primary encounter diagnosis was Closed fracture of left hip, sequela. Diagnoses of Need for immunization against influenza and Drug-induced erectile dysfunction were also pertinent to this visit.  HPI Nicholas Singleton presents for follow up  On multiple issues.    H1) Mobility:  Recovering from a closed  left hip fracture which occurred Jan 26 2018 l  Continue to have extremely slow gait ,  And uses cane outside of the home.  Left hip fracture.  Using arthritis   And ibuprofen.  Has received PT ,  Frequency reduced to  one week.  He is going ot the gym regularly but his upper body workouts cause pain  in his hip.Marland Kitchen    2) Esophageal dystonia Seen at [facility] in August meds reduced and has been off of xenazinde for 2 weeks  Has not changed the drowsiness .  3) ED:  Using cialis at maximal dose of 20 mg , erections are on the soft side , but enough for penetration. However he is unable to achieve orgasm.  "I want to be able to have sex with my wife again before I die."    Has tried all 3 medications,  Has also seen Urology for the pumps which was too painful . Requesting referral to a sex therapist .   Doesn't want to read a 150 page book, which was given to him by a prior therapist  2) getting PT down to one per week.  Bothered by hip pain when he works his upper body.    Wants to resume PT more often and focus on core strengthening and gait  . Max amount was 2/week   3) see #1   Outpatient Medications Prior to Visit  Medication Sig Dispense Refill  . ALPRAZolam (XANAX) 0.5 MG tablet Take 0.25 mg by mouth as needed.     . Calcium Carbonate-Vitamin D 600-400 MG-UNIT tablet Take by mouth.    . clonazePAM (KLONOPIN) 0.5 MG tablet Take 1 tablet by mouth 4 (four) times daily as needed.    . CVS ACETAMINOPHEN EX ST 500 MG tablet TAKE 2 TABLETS (1,000 MG TOTAL) BY MOUTH EVERY 8 (EIGHT) HOURS  0  .  ELIQUIS 2.5 MG TABS tablet TAKE 1 TABLET (2.5 MG TOTAL) BY MOUTH EVERY 12 (TWELVE) HOURS  0  . Eszopiclone (ESZOPICLONE) 3 MG TABS Take 3 mg by mouth at bedtime. Take immediately before bedtime    . hydrocortisone 2.5 % lotion APPLY TO AFFECTED AREAS DAILY, AS NEEDED  3  . ibuprofen (ADVIL,MOTRIN) 800 MG tablet Take 800 mg by mouth every 6 (six) hours as needed. for pain  3  . Lactulose 20 GM/30ML SOLN Take 30 mLs (20 g total) by mouth every 6 (six) hours as needed. To relieve constipatioin 236 mL 1  . Multiple Vitamin (MULTIVITAMIN) tablet Take 1 tablet by mouth daily.    . tadalafil (CIALIS) 20 MG tablet Take 0.5 tablets (10 mg total) by mouth every other day as needed for erectile dysfunction. 10 tablet 11  . traMADol (ULTRAM) 50 MG tablet TAKE 1 TABLET (50 MG TOTAL) BY MOUTH EVERY 4 (FOUR) HOURS AS NEEDED (PAIN) 150 tablet 1  . trihexyphenidyl (ARTANE) 2 MG tablet Take 1 mg by mouth 2 (two) times daily with a meal.     . amitriptyline (ELAVIL) 10 MG tablet Take 20 mg by mouth  at bedtime.     . celecoxib (CELEBREX) 100 MG capsule TAKE 1 CAPSULE BY MOUTH EVERY 12 HOURS  0  . diclofenac sodium (VOLTAREN) 1 % GEL APPLY 4 G TOPICALLY 4 (FOUR) TIMES DAILY  1  . lidocaine (LIDODERM) 5 % PLEASE SEE ATTACHED FOR DETAILED DIRECTIONS  0  . tetrabenazine (XENAZINE) 25 MG tablet Take 12.5 mg by mouth 4 (four) times daily.      No facility-administered medications prior to visit.     Review of Systems;  Patient denies headache, fevers, malaise, unintentional weight loss, skin rash, eye pain, sinus congestion and sinus pain, sore throat, dysphagia,  hemoptysis , cough, dyspnea, wheezing, chest pain, palpitations, orthopnea, edema, abdominal pain, nausea, melena, diarrhea, constipation, flank pain, dysuria, hematuria, urinary  Frequency, nocturia, numbness, tingling, seizures,  Focal weakness, Loss of consciousness,  Tremor, insomnia, depression, anxiety, and suicidal ideation.      Objective:  BP  130/70 (BP Location: Left Arm, Patient Position: Sitting, Cuff Size: Normal)   Pulse 75   Temp 98 F (36.7 C) (Oral)   Resp 15   Ht 5\' 10"  (1.778 m)   Wt 166 lb 6.4 oz (75.5 kg)   SpO2 98%   BMI 23.88 kg/m   BP Readings from Last 3 Encounters:  06/18/18 130/70  02/20/18 94/76  01/27/18 123/73    Wt Readings from Last 3 Encounters:  06/18/18 166 lb 6.4 oz (75.5 kg)  02/20/18 158 lb 9.6 oz (71.9 kg)  01/27/18 187 lb (84.8 kg)    General appearance: alert, cooperative and appears stated age Ears: normal TM's and external ear canals both ears Throat: lips, mucosa, and tongue normal; teeth and gums normal Neck: no adenopathy, no carotid bruit, supple, symmetrical, trachea midline and thyroid not enlarged, symmetric, no tenderness/mass/nodules Back: symmetric, no curvature. ROM normal. No CVA tenderness. Lungs: clear to auscultation bilaterally Heart: regular rate and rhythm, S1, S2 normal, no murmur, click, rub or gallop Abdomen: soft, non-tender; bowel sounds normal; no masses,  no organomegaly Pulses: 2+ and symmetric Skin: Skin color, texture, turgor normal. No rashes or lesions Lymph nodes: Cervical, supraclavicular, and axillary nodes normal. Neuro: Neuro: CNs 2-12 intact. DTRs 2+/4 in biceps, brachioradialis, patellars and achilles. Muscle strength 5/5 in upper and lower exremities. Fine resting tremor bilaterally both hands cerebellar function normal. Romberg negative.  No pronator drift.   Gait antalgic    No results found for: HGBA1C  Lab Results  Component Value Date   CREATININE 1.08 01/27/2018   CREATININE 1.22 01/26/2018   CREATININE 1.08 10/25/2016    Lab Results  Component Value Date   WBC 7.2 01/27/2018   HGB 13.0 01/27/2018   HCT 38.8 (L) 01/27/2018   PLT 147 (L) 01/27/2018   GLUCOSE 103 (H) 01/27/2018   CHOL 193 10/25/2016   TRIG 47.0 10/25/2016   HDL 58.90 10/25/2016   LDLDIRECT 155.2 12/29/2013   LDLCALC 124 (H) 10/25/2016   ALT 20 01/27/2018     AST 25 01/27/2018   NA 135 01/27/2018   K 4.1 01/27/2018   CL 101 01/27/2018   CREATININE 1.08 01/27/2018   BUN 16 01/27/2018   CO2 29 01/27/2018   TSH 0.73 10/25/2016   PSA 1.19 02/03/2015    Dg Chest 1 View  Result Date: 01/26/2018 CLINICAL DATA:  Pain following fall EXAM: CHEST  1 VIEW COMPARISON:  None. FINDINGS: Lungs are clear. Heart size and pulmonary vascularity are normal. No adenopathy. No pneumothorax. No bone lesions. IMPRESSION: No edema or  consolidation. Electronically Signed   By: Bretta Bang III M.D.   On: 01/26/2018 14:18   Dg Hip Unilat With Pelvis 2-3 Views Left  Result Date: 01/26/2018 CLINICAL DATA:  Pain following fall EXAM: DG HIP (WITH OR WITHOUT PELVIS) 2-3V LEFT COMPARISON:  None. FINDINGS: Frontal pelvis as well as frontal and lateral left hip images were obtained. There is a fracture through the midportion of the left femoral neck as well as impaction in the subcapital femoral neck region on the left. Alignment near anatomic. No other fractures are identified. No dislocation. There is symmetric narrowing of both hip joints, moderate. Bones are osteoporotic. IMPRESSION: Left femoral neck fracture midportion as well as impaction in the subcapital femoral neck region. Alignment near anatomic. No other fractures. No dislocation. Moderate symmetric narrowing both hip joints. Bones are osteoporotic. Electronically Signed   By: Bretta Bang III M.D.   On: 01/26/2018 14:17    Assessment & Plan:   Problem List Items Addressed This Visit    Closed left hip fracture (HCC) - Primary   Relevant Orders   Ambulatory referral to Physical Therapy    Other Visit Diagnoses    Need for immunization against influenza       Relevant Orders   Flu Vaccine QUAD 36+ mos IM (Completed)   Drug-induced erectile dysfunction       Relevant Orders   Ambulatory referral to Physical Therapy      I have discontinued Lason L. Kress's tetrabenazine, amitriptyline,  celecoxib, diclofenac sodium, and lidocaine. I am also having him maintain his multivitamin, trihexyphenidyl, Eszopiclone, ALPRAZolam, clonazePAM, tadalafil, CVS ACETAMINOPHEN EX ST, ELIQUIS, Calcium Carbonate-Vitamin D, traMADol, Lactulose, ibuprofen, and hydrocortisone.  No orders of the defined types were placed in this encounter.   Medications Discontinued During This Encounter  Medication Reason  . amitriptyline (ELAVIL) 10 MG tablet   . celecoxib (CELEBREX) 100 MG capsule   . lidocaine (LIDODERM) 5 %   . diclofenac sodium (VOLTAREN) 1 % GEL   . tetrabenazine (XENAZINE) 25 MG tablet     Follow-up: Return in about 6 months (around 12/18/2018).   Sherlene Shams, MD

## 2018-10-28 ENCOUNTER — Other Ambulatory Visit: Payer: Self-pay | Admitting: Internal Medicine

## 2018-10-28 MED ORDER — TRAMADOL HCL 50 MG PO TABS: 100.0000 mg | ORAL_TABLET | Freq: Three times a day (TID) | ORAL | 1 refills | Status: DC | PRN

## 2018-10-29 ENCOUNTER — Telehealth: Payer: Self-pay

## 2018-10-29 NOTE — Telephone Encounter (Signed)
PA for Tramadol has been submitted on covermymeds.  

## 2018-11-05 NOTE — Telephone Encounter (Signed)
PA for Tramadol was not needed because it does not exceed the monthly limit.

## 2018-11-15 DIAGNOSIS — R251 Tremor, unspecified: Secondary | ICD-10-CM | POA: Insufficient documentation

## 2018-11-15 DIAGNOSIS — M810 Age-related osteoporosis without current pathological fracture: Secondary | ICD-10-CM | POA: Insufficient documentation

## 2018-11-15 DIAGNOSIS — T8484XA Pain due to internal orthopedic prosthetic devices, implants and grafts, initial encounter: Secondary | ICD-10-CM | POA: Insufficient documentation

## 2019-01-16 MED ORDER — TADALAFIL 20 MG PO TABS: 10.0000 mg | ORAL_TABLET | ORAL | 11 refills | Status: DC | PRN

## 2019-03-06 MED ORDER — ACETAMINOPHEN 325 MG PO TABS
650.00 | ORAL_TABLET | ORAL | Status: DC
Start: 2019-03-06 — End: 2019-03-06

## 2019-03-06 MED ORDER — KETOROLAC TROMETHAMINE 15 MG/ML IJ SOLN
15.00 | INTRAMUSCULAR | Status: DC
Start: ? — End: 2019-03-06

## 2019-03-06 MED ORDER — TRIHEXYPHENIDYL HCL 2 MG PO TABS
1.00 | ORAL_TABLET | ORAL | Status: DC
Start: 2019-03-07 — End: 2019-03-06

## 2019-03-06 MED ORDER — CLONAZEPAM 0.5 MG PO TABS
0.50 | ORAL_TABLET | ORAL | Status: DC
Start: 2019-03-06 — End: 2019-03-06

## 2019-03-06 MED ORDER — CYCLOBENZAPRINE HCL 10 MG PO TABS
10.00 | ORAL_TABLET | ORAL | Status: DC
Start: ? — End: 2019-03-06

## 2019-03-06 MED ORDER — ASPIRIN EC 325 MG PO TBEC
325.00 | DELAYED_RELEASE_TABLET | ORAL | Status: DC
Start: 2019-03-06 — End: 2019-03-06

## 2019-03-06 MED ORDER — OXYCODONE HCL 5 MG PO TABS
5.00 | ORAL_TABLET | ORAL | Status: DC
Start: ? — End: 2019-03-06

## 2019-03-06 MED ORDER — POLYETHYLENE GLYCOL 3350 17 G PO PACK
1.00 | PACK | ORAL | Status: DC
Start: 2019-03-07 — End: 2019-03-06

## 2019-03-06 MED ORDER — BISACODYL 10 MG RE SUPP
10.00 | RECTAL | Status: DC
Start: ? — End: 2019-03-06

## 2019-03-06 MED ORDER — ALPRAZOLAM 0.5 MG PO TABS
0.50 | ORAL_TABLET | ORAL | Status: DC
Start: ? — End: 2019-03-06

## 2019-03-06 MED ORDER — SENNOSIDES-DOCUSATE SODIUM 8.6-50 MG PO TABS
1.00 | ORAL_TABLET | ORAL | Status: DC
Start: 2019-03-06 — End: 2019-03-06

## 2019-04-29 ENCOUNTER — Ambulatory Visit (INDEPENDENT_AMBULATORY_CARE_PROVIDER_SITE_OTHER): Payer: Medicare Other | Admitting: Sports Medicine

## 2019-04-29 ENCOUNTER — Encounter: Payer: Self-pay | Admitting: Sports Medicine

## 2019-04-29 ENCOUNTER — Other Ambulatory Visit: Payer: Self-pay

## 2019-04-29 VITALS — Temp 98.1°F

## 2019-04-29 DIAGNOSIS — M79674 Pain in right toe(s): Secondary | ICD-10-CM | POA: Diagnosis not present

## 2019-04-29 DIAGNOSIS — M25473 Effusion, unspecified ankle: Secondary | ICD-10-CM | POA: Diagnosis not present

## 2019-04-29 DIAGNOSIS — M79675 Pain in left toe(s): Secondary | ICD-10-CM

## 2019-04-29 DIAGNOSIS — B351 Tinea unguium: Secondary | ICD-10-CM | POA: Diagnosis not present

## 2019-04-29 NOTE — Progress Notes (Signed)
Subjective: Nicholas Singleton is a 63 y.o. male patient seen today in office with complaint of mildly painful thickened and discolored nails. Patient is desiring treatment for nail changes; has tried OTC topicals/Medication in the past with no improvement. Reports that nails are becoming difficult to manage because of the thickness.  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  Patient also states that he has issues with swelling at ankles.  Patient wife reports that this has been chronic since his knee replacement surgery which was last done in June with swelling more so on the left surgical site compared to the right.  Wife reports that they are using compression stockings with some relief.  Patient has no other pedal complaints at this time.   Review of Systems  Cardiovascular: Positive for leg swelling.  All other systems reviewed and are negative.    Patient Active Problem List   Diagnosis Date Noted  . Hospital discharge follow-up 02/23/2018  . Closed left hip fracture ([place]) 01/26/2018  . Chronic insomnia 01/26/2018  . Hip fracture ([place]) 01/26/2018  . Weakness 08/24/2017  . Pain of right hip joint 10/26/2016  . Loss of weight 02/07/2015  . Onychomycosis 11/20/2013  . Cognitive decline 08/26/2013  . Dystonia 11/28/2012  . 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 Weight gain due to medication 10/21/2012  . Routine general medical examination at a health care facility 04/21/2012  . Constipation, chronic 01/18/2012  . Dysphonia     Current Outpatient Medications on File Prior to Visit  Medication Sig Dispense Refill  . ALPRAZolam (XANAX) 0.5 MG tablet Take 0.25 mg by mouth as needed.     . clonazePAM (KLONOPIN) 0.5 MG tablet Take 1 tablet by mouth 4 (four) times daily as needed.    . [facility] ACETAMINOPHEN EX ST 500 MG tablet TAKE 2 TABLETS (1,000 MG TOTAL) BY MOUTH EVERY 8 (EIGHT) HOURS  0  .  Eszopiclone (ESZOPICLONE) 3 MG TABS Take 3 mg by mouth at bedtime. Take immediately before bedtime    . hydrocortisone 2.5 % lotion APPLY TO AFFECTED AREAS DAILY, AS NEEDED  3  . ibuprofen (ADVIL,MOTRIN) 800 MG tablet Take 800 mg by mouth every 6 (six) hours as needed. for pain  3  . Lactulose 20 GM/30ML SOLN Take 30 mLs (20 g total) by mouth every 6 (six) hours as needed. To relieve constipatioin 236 mL 1  . Multiple Vitamin (MULTIVITAMIN) tablet Take 1 tablet by mouth daily.    . tadalafil (ADCIRCA/CIALIS) 20 MG tablet Take 0.5 tablets (10 mg total) by mouth every other day as needed for erectile dysfunction. 10 tablet 11  . traMADol (ULTRAM) 50 MG tablet Take 2 tablets (100 mg total) by mouth 3 (three) times daily as needed. 180 tablet 1  . trihexyphenidyl (ARTANE) 2 MG tablet Take 1 mg by mouth 2 (two) times daily with a meal.     . ELIQUIS 2.5 MG TABS tablet TAKE 1 TABLET (2.5 MG TOTAL) BY MOUTH EVERY 12 (TWELVE) HOURS  0   No current facility-administered medications on file prior to visit.     Allergies  Allergen Reactions  . Molds & Smuts Rash  . Uncaria Tomentosa (Cats Claw) Rash  . Wasp Venom Protein Rash    Objective: Physical Exam  General: Well developed, nourished, no acute distress, awake, alert and oriented x 3  Vascular: Dorsalis pedis artery 2/4 bilateral, Posterior tibial artery 1/4 bilateral,  skin temperature warm to warm proximal to distal bilateral lower extremities, 1+ pitting edema bilateral, no varicosities, pedal hair present bilateral.  Neurological: Gross sensation present via light touch bilateral.   Dermatological: Skin is warm, dry, and supple bilateral, Nails 1-10 are tender, short thick, and discolored with mild subungal debris, + polish (goes for pedicures), no webspace macerations present bilateral, no open lesions present bilateral, no callus/corns/hyperkeratotic tissue present bilateral. No signs of infection bilateral.  Musculoskeletal: Mild  hammertoe boney deformities noted bilateral. Muscular strength within normal limits without painon range of motion. No pain with calf compression bilateral.  Assessment and Plan:  Problem List Items Addressed This Visit      Musculoskeletal and Integument   Onychomycosis - Primary    Other Visit Diagnoses    Toe pain, bilateral       Swelling of ankle, unspecified laterality          -Examined patient -Discussed treatment options for painful dystrophic nails  -Fungal culture was obtained by removing a portion of the hard nail itself from each of the involved toenails using a sterile nail nipper and [facility]. Patient tolerated the biopsy procedure well without discomfort or need for anesthesia.  -For edema recommend elevation of legs when sitting and continued use of compression stockings if continues to worsen discussed with orthopedic or PCP about medication management for edema control -Patient to return in 4 weeks for follow up evaluation and discussion of fungal culture results or sooner if symptoms worsen.  Asencion Islamitorya Patte Winkel, DPM

## 2019-06-03 ENCOUNTER — Ambulatory Visit (INDEPENDENT_AMBULATORY_CARE_PROVIDER_SITE_OTHER): Payer: Medicare Other | Admitting: Sports Medicine

## 2019-06-03 ENCOUNTER — Other Ambulatory Visit: Payer: Self-pay

## 2019-06-03 DIAGNOSIS — M25473 Effusion, unspecified ankle: Secondary | ICD-10-CM | POA: Diagnosis not present

## 2019-06-03 DIAGNOSIS — M79674 Pain in right toe(s): Secondary | ICD-10-CM

## 2019-06-03 DIAGNOSIS — M79675 Pain in left toe(s): Secondary | ICD-10-CM

## 2019-06-03 DIAGNOSIS — B351 Tinea unguium: Secondary | ICD-10-CM

## 2019-06-04 ENCOUNTER — Encounter: Payer: Self-pay | Admitting: Sports Medicine

## 2019-06-04 NOTE — Progress Notes (Signed)
Subjective: GARYN ARLOTTA is a 63 y.o. male patient seen today in office for fungal culture results. Patient has no other pedal complaints at this time.   Patient Active Problem List   Diagnosis Date Noted  . Hospital discharge follow-up 02/23/2018  . Closed left hip fracture ([facility]) 01/26/2018  . Chronic insomnia 01/26/2018  . Hip fracture ([facility]) 01/26/2018  . Weakness 08/24/2017  . Pain of right hip joint 10/26/2016  . Loss of weight 02/07/2015  . Onychomycosis 11/20/2013  . Cognitive decline 08/26/2013  . Dystonia 11/28/2012  . 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 Weight gain due to medication 10/21/2012  . Routine general medical examination at a health care facility 04/21/2012  . Constipation, chronic 01/18/2012  . Dysphonia     Current Outpatient Medications on File Prior to Visit  Medication Sig Dispense Refill  . ALPRAZolam (XANAX) 0.5 MG tablet Take 0.25 mg by mouth as needed.     . clonazePAM (KLONOPIN) 0.5 MG tablet Take 1 tablet by mouth 4 (four) times daily as needed.    . CVS ACETAMINOPHEN EX ST 500 MG tablet TAKE 2 TABLETS (1,000 MG TOTAL) BY MOUTH EVERY 8 (EIGHT) HOURS  0  . ELIQUIS 2.5 MG TABS tablet TAKE 1 TABLET (2.5 MG TOTAL) BY MOUTH EVERY 12 (TWELVE) HOURS  0  . Eszopiclone (ESZOPICLONE) 3 MG TABS Take 3 mg by mouth at bedtime. Take immediately before bedtime    . hydrocortisone 2.5 % lotion APPLY TO AFFECTED AREAS DAILY, AS NEEDED  3  . ibuprofen (ADVIL,MOTRIN) 800 MG tablet Take 800 mg by mouth every 6 (six) hours as needed. for pain  3  . Lactulose 20 GM/30ML SOLN Take 30 mLs (20 g total) by mouth every 6 (six) hours as needed. To relieve constipatioin 236 mL 1  . Multiple Vitamin (MULTIVITAMIN) tablet Take 1 tablet by mouth daily.    . tadalafil (ADCIRCA/CIALIS) 20 MG tablet Take 0.5 tablets (10 mg total) by mouth every other day as needed for erectile dysfunction. 10 tablet 11  . traMADol (ULTRAM) 50 MG tablet Take 2 tablets  (100 mg total) by mouth 3 (three) times daily as needed. 180 tablet 1  . trihexyphenidyl (ARTANE) 2 MG tablet Take 1 mg by mouth 2 (two) times daily with a meal.      No current facility-administered medications on file prior to visit.     Allergies  Allergen Reactions  . Molds & Smuts Rash  . Uncaria Tomentosa (Cats Claw) Rash  . Wasp Venom Protein Rash    Objective: Physical Exam  General: Well developed, nourished, no acute distress, awake, alert and oriented x 3  Vascular: Dorsalis pedis artery 1/4 bilateral, Posterior tibial artery 1/4 bilateral, skin temperature warm to warm proximal to distal bilateral lower extremities, no varicosities, pedal hair present bilateral.  Edema present bilateral.  Neurological: Gross sensation present via light touch bilateral.   Dermatological: Skin is warm, dry, and supple bilateral, Nails 1-10 are tender, short thick, and discolored with mild subungal debris, no webspace macerations present bilateral, no open lesions present bilateral, no callus/corns/hyperkeratotic tissue present bilateral. No signs of infection bilateral.  Musculoskeletal: Asymptomatic hammertoe boney deformities noted bilateral. Muscular strength within normal limits without painon range of motion. No pain with calf compression bilateral.  Fungal culture positive of fungus T rubrum  Assessment and Plan:  Problem List Items Addressed This Visit      Musculoskeletal and Integument   Onychomycosis - Primary  Relevant Orders   Hepatic Function Panel    Other Visit Diagnoses    Toe pain, bilateral       Swelling of ankle, unspecified laterality          -Examined patient -Discussed treatment options for painful mycotic nails -Patient opt for oral Lamisil with full understanding of medication risks; ordered LFTs for review if within normal limits will proceed with sending Rx to pharmacy for lamisil 250mg  PO daily. Anticipate 12 week course.  -Patient opt for laser as  well -Advised good hygiene habits -Patient to return for laser or sooner if symptoms worsen.  Landis Martins, DPM

## 2019-06-13 ENCOUNTER — Other Ambulatory Visit: Payer: Self-pay | Admitting: Sports Medicine

## 2019-06-13 ENCOUNTER — Telehealth: Payer: Self-pay

## 2019-06-13 LAB — HEPATIC FUNCTION PANEL
AG Ratio: 1.4 (calc) (ref 1.0–2.5)
ALT: 12 U/L (ref 9–46)
AST: 22 U/L (ref 10–35)
Albumin: 4.2 g/dL (ref 3.6–5.1)
Alkaline phosphatase (APISO): 49 U/L (ref 35–144)
Bilirubin, Direct: 0.1 mg/dL (ref 0.0–0.2)
Globulin: 3 g/dL (calc) (ref 1.9–3.7)
Indirect Bilirubin: 0.3 mg/dL (calc) (ref 0.2–1.2)
Total Bilirubin: 0.4 mg/dL (ref 0.2–1.2)
Total Protein: 7.2 g/dL (ref 6.1–8.1)

## 2019-06-13 MED ORDER — TERBINAFINE HCL 250 MG PO TABS: 250.0000 mg | ORAL_TABLET | Freq: Every day | ORAL | 0 refills | Status: DC

## 2019-06-13 NOTE — Progress Notes (Signed)
Sent Lamisil to pharmacy

## 2019-06-13 NOTE — Telephone Encounter (Signed)
Spoke with Pt about his lab results, advised the pt about his lamisil Rx sent to his pharmacy and the Dr's instructions , Pt. Stated understanding

## 2019-06-13 NOTE — Telephone Encounter (Signed)
-----   Message from Landis Martins, [place] sent at 06/13/2019  7:35 AM EDT ----- LFTs normal. Will you let patient know that I have sent Lamisil to his pharmacy. After he starts the medication he should make a follow up appointment to be seen in office in 6 weeks. Thanks Dr. Cannon Kettle

## 2019-06-27 ENCOUNTER — Other Ambulatory Visit: Payer: Self-pay

## 2019-06-27 ENCOUNTER — Ambulatory Visit (INDEPENDENT_AMBULATORY_CARE_PROVIDER_SITE_OTHER): Payer: Self-pay

## 2019-06-27 DIAGNOSIS — B351 Tinea unguium: Secondary | ICD-10-CM

## 2019-06-30 NOTE — Progress Notes (Signed)
Pt presents with mycotic infection of nails 1-5 bilateral.  All other systems are negative  Laser therapy administered to affected nails and tolerated well. All safety precautions were in place.  1st treatment.  Follow up in 4 weeks     

## 2019-08-08 ENCOUNTER — Ambulatory Visit (INDEPENDENT_AMBULATORY_CARE_PROVIDER_SITE_OTHER): Payer: Federal, State, Local not specified - PPO | Admitting: *Deleted

## 2019-08-08 ENCOUNTER — Other Ambulatory Visit: Payer: Self-pay

## 2019-08-08 DIAGNOSIS — B351 Tinea unguium: Secondary | ICD-10-CM

## 2019-08-08 NOTE — Progress Notes (Signed)
Patient presents today for the 2nd laser treatment. Diagnosed with mycotic nail infection by Dr. Cannon Kettle. All toenails affected.  All other systems are negative.  Nails were filed thin. Laser therapy was administered to 1-5 toenails bilateral and patient tolerated the treatment well. All safety precautions were in place.   Patient is also taking oral terbinafine.  Follow up in 4 weeks for laser # 3.

## 2019-08-08 NOTE — Patient Instructions (Signed)

## 2019-09-08 ENCOUNTER — Other Ambulatory Visit: Payer: Self-pay

## 2019-09-08 ENCOUNTER — Ambulatory Visit (INDEPENDENT_AMBULATORY_CARE_PROVIDER_SITE_OTHER): Payer: Federal, State, Local not specified - PPO | Admitting: *Deleted

## 2019-09-08 DIAGNOSIS — B351 Tinea unguium: Secondary | ICD-10-CM

## 2019-09-08 NOTE — Progress Notes (Signed)
Patient presents today for the 3rd laser treatment. Diagnosed with mycotic nail infection by Dr. Marylene Land. All toenails affected. His daughter states his nails do not look as dark.  All other systems are negative.  Nails were filed thin. Laser therapy was administered to 1-5 toenails bilateral and patient tolerated the treatment well. All safety precautions were in place.   Patient will finish up the oral terbinafine this week.  Follow up in 4 weeks for laser #4.

## 2019-10-13 ENCOUNTER — Ambulatory Visit: Payer: Federal, State, Local not specified - PPO | Admitting: *Deleted

## 2019-10-13 ENCOUNTER — Other Ambulatory Visit: Payer: Self-pay

## 2019-10-13 DIAGNOSIS — B351 Tinea unguium: Secondary | ICD-10-CM

## 2019-10-13 NOTE — Progress Notes (Signed)
Patient presents today for the 4th laser treatment. Diagnosed with mycotic nail infection by Dr. Marylene Land. All toenails affected. His toenails are not as dark today and are making progress. His daughter is not with him today.  All other systems are negative.  Nails were filed thin. Laser therapy was administered to 1-5 toenails bilateral and patient tolerated the treatment well. All safety precautions were in place.   Patient has been taking terbinafine.  Follow up in 4 weeks for laser #5.

## 2019-10-23 ENCOUNTER — Encounter: Payer: Self-pay | Admitting: Internal Medicine

## 2019-10-24 ENCOUNTER — Ambulatory Visit (INDEPENDENT_AMBULATORY_CARE_PROVIDER_SITE_OTHER): Payer: Medicare Other | Admitting: Internal Medicine

## 2019-10-24 ENCOUNTER — Encounter: Payer: Self-pay | Admitting: Internal Medicine

## 2019-10-24 VITALS — Ht 70.0 in | Wt 166.0 lb

## 2019-10-24 DIAGNOSIS — R531 Weakness: Secondary | ICD-10-CM

## 2019-10-24 DIAGNOSIS — F5104 Psychophysiologic insomnia: Secondary | ICD-10-CM

## 2019-10-24 DIAGNOSIS — R490 Dysphonia: Secondary | ICD-10-CM

## 2019-10-24 DIAGNOSIS — R0683 Snoring: Secondary | ICD-10-CM | POA: Diagnosis not present

## 2019-10-24 DIAGNOSIS — R7301 Impaired fasting glucose: Secondary | ICD-10-CM

## 2019-10-24 DIAGNOSIS — N522 Drug-induced erectile dysfunction: Secondary | ICD-10-CM

## 2019-10-24 DIAGNOSIS — F1320 Sedative, hypnotic or anxiolytic dependence, uncomplicated: Secondary | ICD-10-CM

## 2019-10-24 DIAGNOSIS — J383 Other diseases of vocal cords: Secondary | ICD-10-CM

## 2019-10-24 DIAGNOSIS — D696 Thrombocytopenia, unspecified: Secondary | ICD-10-CM

## 2019-10-24 DIAGNOSIS — S72002S Fracture of unspecified part of neck of left femur, sequela: Secondary | ICD-10-CM

## 2019-10-24 NOTE — Progress Notes (Signed)
Virtual Visit via [email or web address]  This visit type was conducted due to national recommendations for restrictions regarding the COVID-19 pandemic (e.g. social distancing).  This format is felt to be most appropriate for this patient at this time.  All issues noted in this document were discussed and addressed.  No physical exam was performed (except for noted visual exam findings with Video Visits).   I connected with@ on 10/25/19 at 10:30 AM EST by a video enabled telemedicine application  and verified that I am speaking with the correct person using two identifiers. Location patient: home Location provider: work or home office Persons participating in the virtual visit: patient, provider and patient's wife   I discussed the limitations, risks, security and privacy concerns of performing an evaluation and management service by telephone and the availability of in person appointments. I also discussed with the patient that there may be a patient responsible charge related to this service. The patient expressed understanding and agreed to proceed.  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 His wife states that some mornings he is unable to get out of bed without assistance and has to use a cane   ROS: See pertinent positives and negatives per HPI.  Past Medical History:  Diagnosis Date  . Dysphonia     Past Surgical History:  Procedure Laterality Date  . TONSILLECTOMY      Family History  Problem Relation Age of Onset  . Hypertension Mother   . Diabetes Mother   . Hypertension Father   . Diabetes Father   . Stroke Father   . Cancer Brother 22       prostate ,      SOCIAL HX:  reports that he has been smoking cigars. He has never used smokeless tobacco. He reports current alcohol use of about 1.0  standard drinks of alcohol per week. He reports that he does not use drugs.   Current Outpatient Medications:  .  ALPRAZolam (XANAX) 0.5 MG tablet, Take 0.25 mg by mouth as needed. , Disp: , Rfl:  .  clonazePAM (KLONOPIN) 0.5 MG tablet, Take 1 tablet by mouth 4 (four) times daily as needed., Disp: , Rfl:  .  CVS ACETAMINOPHEN EX ST 500 MG tablet, TAKE 2 TABLETS (1,000 MG TOTAL) BY MOUTH EVERY 8 (EIGHT) HOURS, Disp: , Rfl: 0 .  ELIQUIS 2.5 MG TABS tablet, TAKE 1 TABLET (2.5 MG TOTAL) BY MOUTH EVERY 12 (TWELVE) HOURS, Disp: , Rfl: 0 .  Eszopiclone (ESZOPICLONE) 3 MG TABS, Take 3 mg by mouth at bedtime. Take immediately before bedtime, Disp: , Rfl:  .  hydrocortisone 2.5 % lotion, APPLY TO AFFECTED AREAS DAILY, AS NEEDED, Disp: , Rfl: 3 .  ibuprofen (ADVIL,MOTRIN) 800 MG tablet, Take 800 mg by mouth every 6 (six) hours as needed. for pain, Disp: , Rfl: 3 .  ketoconazole (NIZORAL) 2 % shampoo, PLEASE SEE ATTACHED FOR DETAILED DIRECTIONS, Disp: , Rfl:  .  Lactulose 20 GM/30ML SOLN, Take 30 mLs (20 g total) by mouth every 6 (six) hours as needed. To relieve constipatioin, Disp: 236 mL, Rfl: 1 .  Multiple Vitamin (MULTIVITAMIN) tablet, Take 1 tablet by mouth daily., Disp: , Rfl:  .  tadalafil (ADCIRCA/CIALIS) 20 MG tablet, Take 0.5 tablets (10 mg total) by mouth every other day as needed for  erectile dysfunction., Disp: 10 tablet, Rfl: 11 .  trihexyphenidyl (ARTANE) 2 MG tablet, Take 1 mg by mouth 2 (two) times daily with a meal. , Disp: , Rfl:  .  traMADol (ULTRAM) 50 MG tablet, Take 2 tablets (100 mg total) by mouth 3 (three) times daily as needed. (Patient not taking: Reported on 10/23/2019), Disp: 180 tablet, Rfl: 1  EXAM:  VITALS per patient if applicable:  GENERAL: alert, oriented, appears well and in no acute distress  HEENT: atraumatic, conjunttiva clear, no obvious abnormalities on inspection of external nose and ears  NECK: normal movements of the head and neck  LUNGS: on inspection no  signs of respiratory distress, breathing rate appears normal, no obvious gross SOB, gasping or wheezing  CV: no obvious cyanosis  MS: moves all visible extremities without noticeable abnormality  PSYCH/NEURO: pleasant and cooperative, no obvious depression or anxiety, speech and thought processing grossly intact  ASSESSMENT AND PLAN:  Discussed the following assessment and plan:  Weakness - Plan: Comprehensive metabolic panel, Ambulatory referral to Sleep Studies, Ambulatory referral to Physical Therapy  Thrombocytopenia ([place]) - Plan: CBC with Differential/Platelet  Impaired fasting glucose - Plan: Hemoglobin A1c, Lipid panel  Snoring - Plan: Ambulatory referral to Sleep Studies  Dysphonia  Dysphonia spastica - Plan: Ambulatory referral to Sleep Studies  Chronic insomnia - Plan: Ambulatory referral to Sleep Studies  Closed fracture of left hip, sequela - Plan: DG Bone Density  Drug-induced impotence  Benzodiazepine dependence ([place])  Closed left hip fracture ([place]) He is walking with a cane occasionally after his second surgery   . He has not had a DEXA scan yet.  This has been ordered today   Drug-induced impotence Considerable energy has been was spent  In trying to remedy his impotence . Unfortunately the cavernous injections did not produce enough of an erection to faciitate penetration and there appear to be no alternatives for the medications he requires to manage his dysphonia.   Generalized weakness The intermittent nature of his episode suggests that medication or sleep habits may be contributing.  Referral to [facility] and sleep study ordered   Benzodiazepine dependence ([place]) He has been taking Klonipin 0.5 mg 4 times daily for years.  A very slow taper is in process,  By 0.25 mg reduction every 2 weeks     I discussed the assessment and treatment plan with the patient. The patient was provided an opportunity to ask questions and all were answered. The patient  agreed with the plan and demonstrated an understanding of the instructions.   The patient was advised to call back or seek an in-person evaluation if the symptoms worsen or if the condition fails to improve as anticipated.   I provided  40 minutes of non-face-to-face time during this encounter reviewing patient's current problems and past procedures/imaging studies, providing counseling on the above mentioned problems , and coordination  of care . Crecencio Mc, MD

## 2019-10-24 NOTE — Patient Instructions (Addendum)
I HAVE ORDERED A SLEEP STUDY TO BE DONE AT [facility]  I HAVE MADE THE REFERRAL TO [facility] [place]  I HAVE ORDERED THE "METABOLIC" LABS YOUR NEUROLOGIST RECOMMENDED (TO RULE OUT DIABETES)  I also have ordered the bone density test we discussed at your previous visit , to evaluate for osteoporosis , which should be treated.

## 2019-10-25 DIAGNOSIS — F1320 Sedative, hypnotic or anxiolytic dependence, uncomplicated: Secondary | ICD-10-CM | POA: Insufficient documentation

## 2019-10-25 NOTE — Assessment & Plan Note (Signed)
He has been taking Klonipin 0.5 mg 4 times daily for years.  A very slow taper is in process,  By 0.25 mg reduction every 2 weeks

## 2019-10-25 NOTE — Assessment & Plan Note (Signed)
The intermittent nature of his episode suggests that medication or sleep habits may be contributing.  Referral to [facility] and sleep study ordered

## 2019-10-25 NOTE — Assessment & Plan Note (Addendum)
He is walking with a cane occasionally after his second surgery   . He has not had a DEXA scan yet.  This has been ordered today

## 2019-10-25 NOTE — Assessment & Plan Note (Signed)
Considerable energy has been was spent  In trying to remedy his impotence . Unfortunately the cavernous injections did not produce enough of an erection to faciitate penetration and there appear to be no alternatives for the medications he requires to manage his dysphonia.

## 2019-11-10 ENCOUNTER — Ambulatory Visit (INDEPENDENT_AMBULATORY_CARE_PROVIDER_SITE_OTHER): Payer: Federal, State, Local not specified - PPO | Admitting: *Deleted

## 2019-11-10 ENCOUNTER — Other Ambulatory Visit: Payer: Self-pay

## 2019-11-10 ENCOUNTER — Telehealth: Payer: Self-pay | Admitting: Internal Medicine

## 2019-11-10 DIAGNOSIS — B351 Tinea unguium: Secondary | ICD-10-CM

## 2019-11-10 NOTE — Progress Notes (Signed)
Patient presents today for the 5th laser treatment. Diagnosed with mycotic nail infection by Dr. Marylene Land. All toenails affected. He is pleased with the results of his nails so far.  All other systems are negative.  Nails were filed thin. Laser therapy was administered to 1-5 toenails bilateral and patient tolerated the treatment well. All safety precautions were in place.   Patient has been taking terbinafine.  He was asking for a cream for the callused skin on his heels and achilles area bilateral. I recommended RevitaDerm to try.  Follow up in 4 weeks for laser #6.  Take pic at next visit

## 2019-11-10 NOTE — Telephone Encounter (Signed)
I called pt and left a vm to follow up on appt with sleep med.

## 2019-11-11 ENCOUNTER — Ambulatory Visit: Payer: Medicare Other | Attending: Neurology

## 2019-11-11 DIAGNOSIS — R0683 Snoring: Secondary | ICD-10-CM | POA: Insufficient documentation

## 2019-11-11 DIAGNOSIS — F5101 Primary insomnia: Secondary | ICD-10-CM | POA: Insufficient documentation

## 2019-11-12 ENCOUNTER — Other Ambulatory Visit: Payer: Self-pay

## 2019-11-19 ENCOUNTER — Telehealth: Payer: Self-pay | Admitting: Internal Medicine

## 2019-11-19 DIAGNOSIS — F5104 Psychophysiologic insomnia: Secondary | ICD-10-CM

## 2019-11-19 NOTE — Assessment & Plan Note (Signed)
Sleep study was negative for OSA ,  Positive for limited REM sleep

## 2019-11-19 NOTE — Telephone Encounter (Signed)
Sleep study reported results sent via mychart:  Negative study

## 2019-12-08 ENCOUNTER — Ambulatory Visit (INDEPENDENT_AMBULATORY_CARE_PROVIDER_SITE_OTHER): Payer: Federal, State, Local not specified - PPO | Admitting: *Deleted

## 2019-12-08 ENCOUNTER — Other Ambulatory Visit: Payer: Self-pay

## 2019-12-08 DIAGNOSIS — B351 Tinea unguium: Secondary | ICD-10-CM

## 2019-12-08 NOTE — Progress Notes (Signed)
Patient presents today for the 6th laser treatment. Diagnosed with mycotic nail infection by Dr. Marylene Land.   All toenails affected. He and his daughter are very happy with the result. The nail discoloration has diminished greatly.  All other systems are negative.  Nails were filed thin. Laser therapy was administered to 1-5 toenails bilateral and patient tolerated the treatment well. All safety precautions were in place.   Patient has been taking terbinafine.   Patient has completed the recommended laser treatments. He will follow up with Dr. Marylene Land in 3 months to evaluate progress.   Final picture of nails taken today

## 2020-01-30 DIAGNOSIS — G20A1 Parkinson's disease without dyskinesia, without mention of fluctuations: Secondary | ICD-10-CM | POA: Insufficient documentation

## 2020-01-30 DIAGNOSIS — M76892 Other specified enthesopathies of left lower limb, excluding foot: Secondary | ICD-10-CM | POA: Insufficient documentation

## 2020-01-30 DIAGNOSIS — G20 Parkinson's disease: Secondary | ICD-10-CM | POA: Insufficient documentation

## 2020-01-30 DIAGNOSIS — G259 Extrapyramidal and movement disorder, unspecified: Secondary | ICD-10-CM | POA: Insufficient documentation

## 2020-02-09 ENCOUNTER — Other Ambulatory Visit: Payer: Self-pay | Admitting: Internal Medicine

## 2020-02-09 DIAGNOSIS — M25551 Pain in right hip: Secondary | ICD-10-CM

## 2020-02-11 ENCOUNTER — Other Ambulatory Visit: Payer: Self-pay | Admitting: Orthopedic Surgery

## 2020-02-11 ENCOUNTER — Other Ambulatory Visit (HOSPITAL_COMMUNITY): Payer: Self-pay | Admitting: Orthopedic Surgery

## 2020-02-11 DIAGNOSIS — T84031A Mechanical loosening of internal left hip prosthetic joint, initial encounter: Secondary | ICD-10-CM

## 2020-02-24 ENCOUNTER — Ambulatory Visit (HOSPITAL_COMMUNITY)
Admission: RE | Admit: 2020-02-24 | Discharge: 2020-02-24 | Disposition: A | Discharge status: Home / Self Care | Payer: Medicare Other | Source: Ambulatory Visit | Attending: Orthopedic Surgery | Admitting: Orthopedic Surgery

## 2020-02-24 ENCOUNTER — Other Ambulatory Visit: Payer: Self-pay

## 2020-02-24 DIAGNOSIS — T84031A Mechanical loosening of internal left hip prosthetic joint, initial encounter: Secondary | ICD-10-CM | POA: Diagnosis present

## 2020-02-24 MED ORDER — TECHNETIUM TC 99M MEDRONATE IV KIT
20.6000 | PACK | Freq: Once | INTRAVENOUS | Status: AC | PRN
  Administered 2020-02-24: 20.6 via INTRAVENOUS

## 2020-02-26 ENCOUNTER — Ambulatory Visit (HOSPITAL_COMMUNITY): Admission: RE | Admit: 2020-02-26 | Payer: Medicare Other | Source: Ambulatory Visit

## 2020-02-26 ENCOUNTER — Ambulatory Visit (HOSPITAL_COMMUNITY): Payer: Medicare Other

## 2020-03-23 ENCOUNTER — Ambulatory Visit (INDEPENDENT_AMBULATORY_CARE_PROVIDER_SITE_OTHER): Payer: Medicare Other | Admitting: Sports Medicine

## 2020-03-23 ENCOUNTER — Other Ambulatory Visit: Payer: Self-pay

## 2020-03-23 ENCOUNTER — Encounter: Payer: Self-pay | Admitting: Sports Medicine

## 2020-03-23 DIAGNOSIS — B351 Tinea unguium: Secondary | ICD-10-CM

## 2020-03-23 DIAGNOSIS — L853 Xerosis cutis: Secondary | ICD-10-CM | POA: Diagnosis not present

## 2020-03-23 DIAGNOSIS — M79674 Pain in right toe(s): Secondary | ICD-10-CM

## 2020-03-23 DIAGNOSIS — M25473 Effusion, unspecified ankle: Secondary | ICD-10-CM | POA: Diagnosis not present

## 2020-03-23 DIAGNOSIS — M79675 Pain in left toe(s): Secondary | ICD-10-CM

## 2020-03-23 NOTE — Progress Notes (Signed)
Subjective: Nicholas Singleton is a 64 y.o. male patient seen today in office for follow-up after completing 6 sessions of laser for nail fungus and for follow-up on dry skin. Patient is assisted by wife who reports that there is progress but it has been slow with the laser treatments and reports that there is significant dry skin bilateral with some darkening and dry skin noted to the right lateral ankle.  Patient has no other pedal complaints at this time.   Patient Active Problem List   Diagnosis Date Noted  . 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/2020  . Tendonitis involving left hip abductors 01/30/2020  . Benzodiazepine dependence (HCC) 10/25/2019  . Osteoporosis 11/15/2018  . Painful orthopaedic hardware (HCC) 11/15/2018  . Tremor of both hands 11/15/2018  . Hospital discharge follow-up 02/23/2018  . Pathological fracture of left femur due to osteoporosis (HCC) 01/27/2018  . Closed left hip fracture (HCC) 01/26/2018  . Chronic insomnia 01/26/2018  . Hip fracture (HCC) 01/26/2018  . Generalized weakness 08/24/2017  . Osteoarthritis of knee 04/20/2017  . Hoarseness of voice 04/03/2017  . Pain of right hip joint 10/26/2016  . Loss of weight 02/07/2015  . Onychomycosis 11/20/2013  . Cognitive decline 08/26/2013  . Dystonia 11/28/2012  . 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 Weight gain due to medication 10/21/2012  . Routine general medical examination at a health care facility 04/21/2012  . Constipation, chronic 01/18/2012  . Dysphonia   . Laryngospasms 10/19/2011    Current Outpatient Medications on File Prior to Visit  Medication Sig Dispense Refill  . ALPRAZolam (XANAX) 0.5 MG tablet Take 0.25 mg by mouth as needed.     . clonazePAM (KLONOPIN) 0.5 MG tablet Take 1 tablet by mouth 4 (four) times daily as needed.    . [facility] ACETAMINOPHEN EX ST 500 MG tablet TAKE 2 TABLETS (1,000 MG TOTAL) BY MOUTH EVERY 8 (EIGHT) HOURS  0  . diclofenac Sodium (VOLTAREN) 1 % GEL  diclofenac 1 % topical gel    . ELIQUIS 2.5 MG TABS tablet TAKE 1 TABLET (2.5 MG TOTAL) BY MOUTH EVERY 12 (TWELVE) HOURS  0  . Eszopiclone (ESZOPICLONE) 3 MG TABS Take 3 mg by mouth at bedtime. Take immediately before bedtime    . hydrocortisone 2.5 % cream APPLY TO THE FACE, SCALP AND CHEST ONCE DAILY    . hydrocortisone 2.5 % lotion APPLY TO AFFECTED AREAS DAILY, AS NEEDED  3  . ibuprofen (ADVIL,MOTRIN) 800 MG tablet Take 800 mg by mouth every 6 (six) hours as needed. for pain  3  . ketoconazole (NIZORAL) 2 % shampoo PLEASE SEE ATTACHED FOR DETAILED DIRECTIONS    . Lactulose 20 GM/30ML SOLN Take 30 mLs (20 g total) by mouth every 6 (six) hours as needed. To relieve constipatioin 236 mL 1  . lidocaine (LIDODERM) 5 % lidocaine 5 % topical patch    . methylPREDNISolone (MEDROL DOSEPAK) 4 MG TBPK tablet Take by mouth.    . Multiple Vitamin (MULTIVITAMIN) tablet Take 1 tablet by mouth daily.    Marland Kitchen oxyCODONE (OXY IR/ROXICODONE) 5 MG immediate release tablet oxycodone 5 mg tablet    . polyethylene glycol powder (GLYCOLAX/MIRALAX) 17 GM/SCOOP powder polyethylene glycol 3350 17 gram oral powder packet  TAKE 1 PACKET (17 G TOTAL) BY MOUTH ONCE DAILY FOR 3 DAYS MIX IN 4 8OUNCES OF FLUID PRIOR TO TAKING.    . predniSONE (DELTASONE) 5 MG tablet prednisone 5 mg tablets in a dose pack    .  tadalafil (ADCIRCA/CIALIS) 20 MG tablet Take 0.5 tablets (10 mg total) by mouth every other day as needed for erectile dysfunction. 10 tablet 11  . traMADol (ULTRAM) 50 MG tablet Take 2 tablets (100 mg total) by mouth 3 (three) times daily as needed. 180 tablet 1  . trihexyphenidyl (ARTANE) 2 MG tablet Take 1 mg by mouth 2 (two) times daily with a meal.      No current facility-administered medications on file prior to visit.    No Known Allergies  Objective: Physical Exam  General: Well developed, nourished, no acute distress, awake, alert and oriented x 2  Vascular: Dorsalis pedis artery 1/4 bilateral, Posterior  tibial artery 1/4 bilateral, skin temperature warm to warm proximal to distal bilateral lower extremities, no varicosities, pedal hair present bilateral.  Trace edema present bilateral ankles.  Neurological: Gross sensation present via light touch bilateral.   Dermatological: Skin is warm, dry, and supple bilateral, Nails 1-10 are tender, short thick, and discolored with mild subungal debris with proximal clearance noted, no webspace macerations present bilateral, no open lesions present bilateral, dry skin diffusely plantar surfaces and small dry patch to the right lateral ankle, no callus/corns/hyperkeratotic tissue present bilateral. No signs of infection bilateral.  Musculoskeletal: Asymptomatic hammertoe boney deformities noted bilateral. Muscular strength within normal limits without painon range of motion. No pain with calf compression bilateral.   Assessment and Plan:  Problem List Items Addressed This Visit      Musculoskeletal and Integument   Onychomycosis - Primary    Other Visit Diagnoses    Toe pain, bilateral       Swelling of ankle, unspecified laterality       Dry skin          -Examined patient -Discussed continued care for nail fungus after completing laser -Recommend for patient to purchase formula 7 to use to toenails as instructed -Continue with ammonium lactate for dry skin and advised to use under occlusion or with the use of pumice stone to help as well -Advised patient to sleep with a pillow at the lateral leg to prevent rubbing -Patient to return in 4 months for follow-up evaluation or sooner if problems or issues arise Asencion Islam, DPM

## 2020-05-05 DIAGNOSIS — B353 Tinea pedis: Secondary | ICD-10-CM | POA: Insufficient documentation

## 2020-07-22 ENCOUNTER — Encounter: Payer: Self-pay | Admitting: Sports Medicine

## 2020-07-22 ENCOUNTER — Ambulatory Visit (INDEPENDENT_AMBULATORY_CARE_PROVIDER_SITE_OTHER): Payer: Medicare Other | Admitting: Sports Medicine

## 2020-07-22 ENCOUNTER — Other Ambulatory Visit: Payer: Self-pay

## 2020-07-22 DIAGNOSIS — L853 Xerosis cutis: Secondary | ICD-10-CM

## 2020-07-22 DIAGNOSIS — B351 Tinea unguium: Secondary | ICD-10-CM | POA: Diagnosis not present

## 2020-07-22 DIAGNOSIS — M79674 Pain in right toe(s): Secondary | ICD-10-CM | POA: Diagnosis not present

## 2020-07-22 DIAGNOSIS — M79675 Pain in left toe(s): Secondary | ICD-10-CM | POA: Diagnosis not present

## 2020-07-22 NOTE — Progress Notes (Signed)
Subjective: Nicholas Singleton is a 64 y.o. male patient seen today in office for follow-up on discolored toenails.  Wife reports that he is doing well still has some thickness to the nails and discoloration noted at the right fifth toe.  Patient has no other pedal complaints at this time.   Patient Active Problem List   Diagnosis Date Noted  . 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/2020  . Tendonitis involving left hip abductors 01/30/2020  . Benzodiazepine dependence (HCC) 10/25/2019  . Osteoporosis 11/15/2018  . Painful orthopaedic hardware (HCC) 11/15/2018  . Tremor of both hands 11/15/2018  . Hospital discharge follow-up 02/23/2018  . Pathological fracture of left femur due to osteoporosis (HCC) 01/27/2018  . Closed left hip fracture (HCC) 01/26/2018  . Chronic insomnia 01/26/2018  . Hip fracture (HCC) 01/26/2018  . Generalized weakness 08/24/2017  . Osteoarthritis of knee 04/20/2017  . Hoarseness of voice 04/03/2017  . Pain of right hip joint 10/26/2016  . Loss of weight 02/07/2015  . Onychomycosis 11/20/2013  . Cognitive decline 08/26/2013  . Dystonia 11/28/2012  . 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 Weight gain due to medication 10/21/2012  . Routine general medical examination at a health care facility 04/21/2012  . Constipation, chronic 01/18/2012  . Dysphonia   . Laryngospasms 10/19/2011    Current Outpatient Medications on File Prior to Visit  Medication Sig Dispense Refill  . carbidopa-levodopa (SINEMET IR) 25-100 MG tablet Take by mouth.    Marland Kitchen econazole nitrate 1 % cream Apply topically daily.    . fluconazole (DIFLUCAN) 100 MG tablet Take one tablet by mouth twice daily x one week. Repeat monthly x 3.    . ALPRAZolam (XANAX) 0.5 MG tablet Take 0.25 mg by mouth as needed.     . clonazePAM (KLONOPIN) 0.5 MG tablet Take 1 tablet by mouth 4 (four) times daily as needed.    . [facility] ACETAMINOPHEN EX ST 500 MG tablet TAKE 2 TABLETS (1,000 MG TOTAL) BY  MOUTH EVERY 8 (EIGHT) HOURS  0  . diclofenac Sodium (VOLTAREN) 1 % GEL diclofenac 1 % topical gel    . ELIQUIS 2.5 MG TABS tablet TAKE 1 TABLET (2.5 MG TOTAL) BY MOUTH EVERY 12 (TWELVE) HOURS  0  . Eszopiclone (ESZOPICLONE) 3 MG TABS Take 3 mg by mouth at bedtime. Take immediately before bedtime    . hydrocortisone 2.5 % cream APPLY TO THE FACE, SCALP AND CHEST ONCE DAILY    . hydrocortisone 2.5 % lotion APPLY TO AFFECTED AREAS DAILY, AS NEEDED  3  . ibuprofen (ADVIL,MOTRIN) 800 MG tablet Take 800 mg by mouth every 6 (six) hours as needed. for pain  3  . ketoconazole (NIZORAL) 2 % shampoo PLEASE SEE ATTACHED FOR DETAILED DIRECTIONS    . Lactulose 20 GM/30ML SOLN Take 30 mLs (20 g total) by mouth every 6 (six) hours as needed. To relieve constipatioin 236 mL 1  . lidocaine (LIDODERM) 5 % lidocaine 5 % topical patch    . meloxicam (MOBIC) 15 MG tablet Take 15 mg by mouth daily.    . methylPREDNISolone (MEDROL DOSEPAK) 4 MG TBPK tablet Take by mouth.    . Multiple Vitamin (MULTIVITAMIN) tablet Take 1 tablet by mouth daily.    Marland Kitchen oxyCODONE (OXY IR/ROXICODONE) 5 MG immediate release tablet oxycodone 5 mg tablet    . polyethylene glycol powder (GLYCOLAX/MIRALAX) 17 GM/SCOOP powder polyethylene glycol 3350 17 gram oral powder packet  TAKE 1 PACKET (17 G TOTAL) BY MOUTH ONCE DAILY  FOR 3 DAYS MIX IN 4 8OUNCES OF FLUID PRIOR TO TAKING.    . predniSONE (DELTASONE) 5 MG tablet prednisone 5 mg tablets in a dose pack    . tadalafil (ADCIRCA/CIALIS) 20 MG tablet Take 0.5 tablets (10 mg total) by mouth every other day as needed for erectile dysfunction. 10 tablet 11  . traMADol (ULTRAM) 50 MG tablet Take 2 tablets (100 mg total) by mouth 3 (three) times daily as needed. 180 tablet 1  . trihexyphenidyl (ARTANE) 2 MG tablet Take 1 mg by mouth 2 (two) times daily with a meal.      No current facility-administered medications on file prior to visit.    No Known Allergies  Objective: Physical Exam  General:  Well developed, nourished, no acute distress, awake, alert and oriented x 2  Vascular: Dorsalis pedis artery 1/4 bilateral, Posterior tibial artery 1/4 bilateral, skin temperature warm to warm proximal to distal bilateral lower extremities, no varicosities, pedal hair present bilateral.  Trace edema present bilateral ankles.  Neurological: Gross sensation present via light touch bilateral.   Dermatological: Skin is warm, dry, and supple bilateral, Nails 1-10 are tender, short thick, and discolored with mild subungal debris with proximal clearance noted, most discoloration noted at right fifth toenail, no webspace macerations present bilateral, no open lesions present bilateral, dry skin diffusely plantar surfaces and small dry patch to the right lateral ankle, no callus/corns/hyperkeratotic tissue present bilateral. No signs of infection bilateral.  Musculoskeletal: Asymptomatic hammertoe boney deformities noted bilateral. Muscular strength within normal limits without painon range of motion. No pain with calf compression bilateral.   Assessment and Plan:  Problem List Items Addressed This Visit      Musculoskeletal and Integument   Onychomycosis - Primary   Relevant Medications   econazole nitrate 1 % cream   fluconazole (DIFLUCAN) 100 MG tablet    Other Visit Diagnoses    Toe pain, bilateral       Dry skin          -Examined patient -Re-Discussed continued care for nail fungus after completing laser -Continue with formula 7 and advised daily filing before application -Continue with dermatology recommendations of Diflucan and econazole cream -Return in 6 months for follow-up evaluation or sooner if issues arise Asencion Islam, DPM

## 2020-10-07 DIAGNOSIS — Z96642 Presence of left artificial hip joint: Secondary | ICD-10-CM | POA: Insufficient documentation

## 2020-10-07 DIAGNOSIS — M1611 Unilateral primary osteoarthritis, right hip: Secondary | ICD-10-CM | POA: Insufficient documentation

## 2021-01-20 ENCOUNTER — Ambulatory Visit (INDEPENDENT_AMBULATORY_CARE_PROVIDER_SITE_OTHER): Payer: Medicare Other | Admitting: Sports Medicine

## 2021-01-20 ENCOUNTER — Other Ambulatory Visit: Payer: Self-pay

## 2021-01-20 ENCOUNTER — Encounter: Payer: Self-pay | Admitting: Sports Medicine

## 2021-01-20 DIAGNOSIS — M79674 Pain in right toe(s): Secondary | ICD-10-CM | POA: Diagnosis not present

## 2021-01-20 DIAGNOSIS — L853 Xerosis cutis: Secondary | ICD-10-CM

## 2021-01-20 DIAGNOSIS — M79675 Pain in left toe(s): Secondary | ICD-10-CM | POA: Diagnosis not present

## 2021-01-20 DIAGNOSIS — B351 Tinea unguium: Secondary | ICD-10-CM | POA: Diagnosis not present

## 2021-01-20 NOTE — Progress Notes (Signed)
Subjective: Nicholas Singleton is a 65 y.o. male patient seen today in office for follow-up on discolored toenails after completing Diflucan and metolazone as given by dermatology.  Wife reports that toes are some better has noticed them getting a little darker a tad but otherwise doing very well currently using formula 7.  Patient has no other pedal complaints at this time.   Patient Active Problem List   Diagnosis Date Noted  . History of total hip arthroplasty, left 10/07/2020  . Primary osteoarthritis of right hip 10/07/2020  . Tinea pedis of both feet 05/05/2020  . 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/2020  . Tendonitis involving left hip abductors 01/30/2020  . Benzodiazepine dependence (HCC) 10/25/2019  . Osteoporosis 11/15/2018  . Painful orthopaedic hardware (HCC) 11/15/2018  . Tremor of both hands 11/15/2018  . Hospital discharge follow-up 02/23/2018  . Pathological fracture of left femur due to osteoporosis (HCC) 01/27/2018  . Closed left hip fracture (HCC) 01/26/2018  . Chronic insomnia 01/26/2018  . Hip fracture (HCC) 01/26/2018  . Generalized weakness 08/24/2017  . Osteoarthritis of knee 04/20/2017  . Hoarseness of voice 04/03/2017  . Pain of right hip joint 10/26/2016  . Loss of weight 02/07/2015  . Onychomycosis 11/20/2013  . Cognitive decline 08/26/2013  . Dystonia 11/28/2012  . 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 Weight gain due to medication 10/21/2012  . Routine general medical examination at a health care facility 04/21/2012  . Constipation, chronic 01/18/2012  . Dysphonia   . Laryngospasms 10/19/2011    Current Outpatient Medications on File Prior to Visit  Medication Sig Dispense Refill  . mirabegron ER (MYRBETRIQ) 25 MG TB24 tablet Take by mouth.    . Zoster Vaccine Adjuvanted [facility]) injection     . ALPRAZolam (XANAX) 0.5 MG tablet Take 0.25 mg by mouth as needed.     Marland Kitchen amantadine (SYMMETREL) 100 MG capsule Take by mouth.    .  calcium carbonate (OS-CAL) 1250 (500 Ca) MG chewable tablet Chew by mouth.    . carbidopa-levodopa (SINEMET IR) 25-100 MG tablet Take by mouth.    . clonazePAM (KLONOPIN) 0.5 MG tablet Take 1 tablet by mouth 4 (four) times daily as needed.    . [facility] ACETAMINOPHEN EX ST 500 MG tablet TAKE 2 TABLETS (1,000 MG TOTAL) BY MOUTH EVERY 8 (EIGHT) HOURS  0  . diclofenac Sodium (VOLTAREN) 1 % GEL diclofenac 1 % topical gel    . Docusate Sodium (DSS) 100 MG CAPS Take by mouth.    Marland Kitchen econazole nitrate 1 % cream Apply topically daily.    Marland Kitchen ELIQUIS 2.5 MG TABS tablet TAKE 1 TABLET (2.5 MG TOTAL) BY MOUTH EVERY 12 (TWELVE) HOURS  0  . Eszopiclone (ESZOPICLONE) 3 MG TABS Take 3 mg by mouth at bedtime. Take immediately before bedtime    . fluconazole (DIFLUCAN) 100 MG tablet Take one tablet by mouth twice daily x one week. Repeat monthly x 3.    . Folic Acid-Cholecalciferol 04-33 MG-UNIT TABS Take by mouth.    . hydrocortisone 2.5 % cream APPLY TO THE FACE, SCALP AND CHEST ONCE DAILY    . hydrocortisone 2.5 % lotion APPLY TO AFFECTED AREAS DAILY, AS NEEDED  3  . ibuprofen (ADVIL,MOTRIN) 800 MG tablet Take 800 mg by mouth every 6 (six) hours as needed. for pain  3  . ketoconazole (NIZORAL) 2 % shampoo PLEASE SEE ATTACHED FOR DETAILED DIRECTIONS    . Lactulose 20 GM/30ML SOLN Take 30 mLs (20 g total)  by mouth every 6 (six) hours as needed. To relieve constipatioin 236 mL 1  . lidocaine (LIDODERM) 5 % lidocaine 5 % topical patch    . loperamide (IMODIUM) 2 MG capsule Take by mouth.    . meloxicam (MOBIC) 15 MG tablet Take 15 mg by mouth daily.    . methylPREDNISolone (MEDROL DOSEPAK) 4 MG TBPK tablet Take by mouth.    . Multiple Vitamin (MULTIVITAMIN) tablet Take 1 tablet by mouth daily.    Marland Kitchen oxyCODONE (OXY IR/ROXICODONE) 5 MG immediate release tablet oxycodone 5 mg tablet    . Papav-Phentolamine-Alprostadil (TRI-MIX) 150-5-50 MG-MG-MCG SOLR Inject as directed See admin instructions.    . polyethylene glycol  powder (GLYCOLAX/MIRALAX) 17 GM/SCOOP powder polyethylene glycol 3350 17 gram oral powder packet  TAKE 1 PACKET (17 G TOTAL) BY MOUTH ONCE DAILY FOR 3 DAYS MIX IN 4 8OUNCES OF FLUID PRIOR TO TAKING.    . predniSONE (DELTASONE) 5 MG tablet prednisone 5 mg tablets in a dose pack    . tadalafil (ADCIRCA/CIALIS) 20 MG tablet Take 0.5 tablets (10 mg total) by mouth every other day as needed for erectile dysfunction. 10 tablet 11  . traMADol (ULTRAM) 50 MG tablet Take 2 tablets (100 mg total) by mouth 3 (three) times daily as needed. 180 tablet 1  . triamcinolone cream (KENALOG) 0.1 % Apply topically 2 (two) times daily.    . trihexyphenidyl (ARTANE) 2 MG tablet Take 1 mg by mouth 2 (two) times daily with a meal.      No current facility-administered medications on file prior to visit.    No Known Allergies  Objective: Physical Exam  General: Well developed, nourished, no acute distress, awake, alert and oriented x 2  Vascular: Dorsalis pedis artery 1/4 bilateral, Posterior tibial artery 1/4 bilateral, skin temperature warm to warm proximal to distal bilateral lower extremities, no varicosities, pedal hair present bilateral.  Trace edema present bilateral ankles.  Neurological: Gross sensation present via light touch bilateral.   Dermatological: Skin is warm, dry, and supple bilateral, Nails 1-10 are tender, short thick, and discolored with mild subungal debris with proximal clearance noted, most discoloration noted at right fifth toenail like before, no webspace macerations present bilateral, no open lesions present bilateral, dry skin diffusely plantar surfaces and small dry patch to the right lateral ankle, no callus/corns/hyperkeratotic tissue present bilateral. No signs of infection bilateral.  Musculoskeletal: Asymptomatic hammertoe boney deformities noted bilateral. Muscular strength within normal limits without painon range of motion. No pain with calf compression bilateral.   Assessment  and Plan:  Problem List Items Addressed This Visit      Musculoskeletal and Integument   Onychomycosis - Primary   Relevant Medications   Zoster Vaccine Adjuvanted [facility]) injection    Other Visit Diagnoses    Dry skin       Toe pain, bilateral          -Examined patient -Re-Discussed continued care for nail fungus -Advised patient that we must wait a year before considering to resume PO Diflucan -Advised patient to allow nails to continue to grow out over time and if darkens return to office; recommend to add on vinegar soaking once weekly at minimum -Continue with formula 7 and advised daily filing before application like previous  -Return if fails to continue to improve or sooner if problems or issues arise.  Asencion Islam, DPM

## 2021-01-20 NOTE — Patient Instructions (Addendum)
Vinegar soaks 1 cup of white distilled vinegar to 8 cups of warm water.  Soak 20 mins. May repeat soak 1 to two times per week.  If there is thickness to nails may file nails after soaks or after bath/shower with nail file and apply your topical (formula 7). Apply daily to nails after filing for the best result.

## 2021-06-02 ENCOUNTER — Other Ambulatory Visit: Payer: Self-pay | Admitting: Internal Medicine

## 2021-06-02 DIAGNOSIS — L739 Follicular disorder, unspecified: Secondary | ICD-10-CM

## 2021-06-02 MED ORDER — SULFAMETHOXAZOLE-TRIMETHOPRIM 800-160 MG PO TABS: 1.0000 | ORAL_TABLET | Freq: Two times a day (BID) | ORAL | 0 refills | Status: DC

## 2021-06-02 NOTE — Progress Notes (Signed)
For bacterial infection at nape of neck

## 2021-10-05 ENCOUNTER — Telehealth: Payer: Self-pay | Admitting: Internal Medicine

## 2021-10-05 NOTE — Telephone Encounter (Signed)
Patients wife was in office 10/05/21 and stated she received a message that patient needed to schedule a medicare wellness visit. Please give patient or patients wife a call. Patient is also scheduled for a yearly follow up with PCP 10/26/2021

## 2021-10-26 ENCOUNTER — Other Ambulatory Visit: Payer: Self-pay

## 2021-10-26 ENCOUNTER — Ambulatory Visit (INDEPENDENT_AMBULATORY_CARE_PROVIDER_SITE_OTHER): Payer: Medicare Other | Admitting: Internal Medicine

## 2021-10-26 ENCOUNTER — Encounter: Payer: Self-pay | Admitting: Internal Medicine

## 2021-10-26 VITALS — BP 102/66 | HR 73 | Temp 98.4°F | Ht 70.0 in | Wt 180.4 lb

## 2021-10-26 DIAGNOSIS — M80052D Age-related osteoporosis with current pathological fracture, left femur, subsequent encounter for fracture with routine healing: Secondary | ICD-10-CM | POA: Diagnosis not present

## 2021-10-26 DIAGNOSIS — Z0000 Encounter for general adult medical examination without abnormal findings: Secondary | ICD-10-CM

## 2021-10-26 DIAGNOSIS — F02B0 Dementia in other diseases classified elsewhere, moderate, without behavioral disturbance, psychotic disturbance, mood disturbance, and anxiety: Secondary | ICD-10-CM | POA: Diagnosis not present

## 2021-10-26 DIAGNOSIS — G3183 Dementia with Lewy bodies: Secondary | ICD-10-CM | POA: Diagnosis not present

## 2021-10-26 DIAGNOSIS — E782 Mixed hyperlipidemia: Secondary | ICD-10-CM | POA: Diagnosis not present

## 2021-10-26 DIAGNOSIS — Z125 Encounter for screening for malignant neoplasm of prostate: Secondary | ICD-10-CM

## 2021-10-26 DIAGNOSIS — Z8781 Personal history of (healed) traumatic fracture: Secondary | ICD-10-CM | POA: Diagnosis not present

## 2021-10-26 DIAGNOSIS — L739 Follicular disorder, unspecified: Secondary | ICD-10-CM

## 2021-10-26 NOTE — Assessment & Plan Note (Signed)
DEXA ordered.  Testosterone level is high.

## 2021-10-26 NOTE — Assessment & Plan Note (Signed)

## 2021-10-26 NOTE — Assessment & Plan Note (Signed)
Will treat with doxycycline next occurrence

## 2021-10-26 NOTE — Patient Instructions (Signed)
For your hip pain:   You can take up to 2000 mg of acetominophen (tylenol) every day safely  In divided doses (500 mg every 6 hours  Or 1000 mg every 12 hours.)   If you add an  NSAID regularly add daily omeprazole 20 mg daily to protect the stomach

## 2021-10-26 NOTE — Progress Notes (Signed)
Patient ID: Nicholas Singleton, male    DOB: 09-Jul-1956  Age: 66 y.o. MRN: [id number]  The patient is here for annual preventive  examination and management of other chronic and acute problems.  This visit occurred during the SARS-CoV-2 public health emergency.  Safety protocols were in place, including screening questions prior to the visit, additional usage of staff PPE, and extensive cleaning of exam room while observing appropriate contact time as indicated for disinfecting solutions.     The risk factors are reflected in the social history.  The roster of all physicians providing medical care to patient - is listed in the Snapshot section of the chart.  Activities of daily living:  The patient is 100% independent in all ADLs: dressing, toileting, feeding as well as independent mobility  Home safety : The patient has smoke detectors in the home. They wear seatbelts.  There are no firearms at home. There is no violence in the home.   There is no risks for hepatitis, STDs or HIV. There is no   history of blood transfusion. They have no travel history to infectious disease endemic areas of the world.  The patient has seen their dentist in the last six month. They have seen their eye doctor in the last year. They admit to slight hearing difficulty with regard to whispered voices and some television programs.  They have deferred audiologic testing in the last year.  They do not  have excessive sun exposure. Discussed the need for sun protection: hats, long sleeves and use of sunscreen if there is significant sun exposure.   Diet: the importance of a healthy diet is discussed. They do have a healthy diet.  The benefits of regular aerobic exercise were discussed. She walks 4 times per week ,  20 minutes.   Depression screen: there are no signs or vegative symptoms of depression- irritability, change in appetite, anhedonia, sadness/tearfullness.  Cognitive assessment: the patient manages all their  financial and personal affairs and is actively engaged. They could relate day,date,year and events; recalled 2/3 objects at 3 minutes; performed clock-face test normally.  The following portions of the patient's history were reviewed and updated as appropriate: allergies, current medications, past family history, past medical history,  past surgical history, past social history  and problem list.  Visual acuity was not assessed per patient preference since she has regular follow up with her ophthalmologist. Hearing and body mass index were assessed and reviewed.   During the course of the visit the patient was educated and counseled about appropriate screening and preventive services including : fall prevention , diabetes screening, nutrition counseling, colorectal cancer screening, and recommended immunizations.    CC: The primary encounter diagnosis was Prostate cancer screening. 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 Marland Kitchen   1) recurrent  rash at base of scalp, not present now. Presents as small white pustules.  Wife treats with hydrocortisone and witch hazel . Gets a haircut and neck shaved monthly   2) Dementia:  ongoing evaluation at [facility] in [place] .  Lewy body dementia suspected . 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 Frequent falls .  Not wearing a medic  alert or apple watch yet.   4) persistent hip pain 3 yrs post hip replacement . Has had multiple opinions [name] has a past medical history of Dysphonia.   He has a past surgical history that includes Tonsillectomy.   His family history includes Cancer (age of onset: 40) in his brother; Diabetes in his father and mother; Hypertension in his father and mother; Stroke in his father.He reports that he has been smoking cigars. He has never used smokeless tobacco. He reports current alcohol use of about 1.0 standard drink per week. He reports that he does not use drugs.  Outpatient Medications Prior to Visit  Medication Sig Dispense Refill   ALPRAZolam (XANAX) 0.5 MG tablet Take 0.25 mg by mouth as needed.      calcium carbonate (OS-CAL) 1250 (500 Ca) MG chewable tablet Chew by mouth.     clonazePAM (KLONOPIN) 0.5 MG tablet Take 1 tablet by mouth 4 (four) times daily as needed.     [facility] ACETAMINOPHEN EX ST 500 MG tablet TAKE 2 TABLETS (1,000 MG TOTAL) BY MOUTH EVERY 8 (EIGHT) HOURS  0   econazole nitrate 1 % cream Apply topically daily.     hydrocortisone 2.5 % cream APPLY TO THE FACE, SCALP AND CHEST ONCE DAILY     ibuprofen (ADVIL,MOTRIN) 800 MG tablet Take 800 mg by mouth every 6 (six) hours as needed. for pain  3   ketoconazole (NIZORAL) 2 % shampoo PLEASE SEE ATTACHED FOR DETAILED DIRECTIONS     loperamide (IMODIUM) 2 MG capsule Take by mouth.     Multiple Vitamin (MULTIVITAMIN) tablet Take 1 tablet by mouth daily.     Papav-Phentolamine-Alprostadil (TRI-MIX) 150-5-50 MG-MG-MCG SOLR Inject as directed See admin instructions.     polyethylene glycol powder (GLYCOLAX/MIRALAX) 17 GM/SCOOP powder polyethylene glycol 3350 17 gram oral powder packet  TAKE 1 PACKET (17 G TOTAL) BY MOUTH ONCE DAILY FOR 3 DAYS MIX IN 4 8OUNCES OF FLUID PRIOR TO TAKING.     triamcinolone cream (KENALOG) 0.1 % Apply topically 2 (two) times daily.     trihexyphenidyl (ARTANE) 2 MG tablet Take 1 mg by mouth 2  (two) times daily with a meal.      amantadine (SYMMETREL) 100 MG capsule Take by mouth.     Calcium Carb-Cholecalciferol 600-20 MG-MCG CHEW Chew by mouth.     diclofenac Sodium (VOLTAREN) 1 % GEL diclofenac 1 % topical gel (Patient not taking: Reported on 10/26/2021)     Docusate Sodium (DSS) 100 MG CAPS Take by mouth. (Patient not taking: Reported on 10/26/2021)     ELIQUIS 2.5 MG TABS tablet TAKE 1 TABLET (2.5 MG TOTAL) BY MOUTH EVERY 12 (TWELVE) HOURS (Patient not taking: Reported on 10/26/2021)  0   Eszopiclone 3 MG TABS Take 3 mg by mouth at bedtime. Take immediately before bedtime (Patient not taking: Reported on 10/26/2021)     fluconazole (DIFLUCAN) 100 MG tablet Take one tablet by mouth twice daily x one week. Repeat monthly x 3. (Patient not taking: Reported on 5/40/0867)     Folic Acid-Cholecalciferol 09-4998 MG-UNIT TABS Take by mouth. (Patient not taking: Reported on 10/26/2021)     Lactulose 20 GM/30ML SOLN Take 30 mLs (20 g total) by mouth every 6 (six) hours as needed.  To relieve constipatioin (Patient not taking: Reported on 10/26/2021) 236 mL 1   lidocaine (LIDODERM) 5 % lidocaine 5 % topical patch (Patient not taking: Reported on 10/26/2021)     meloxicam (MOBIC) 15 MG tablet Take 15 mg by mouth daily. (Patient not taking: Reported on 10/26/2021)     oxyCODONE (OXY IR/ROXICODONE) 5 MG immediate release tablet oxycodone 5 mg tablet (Patient not taking: Reported on 10/26/2021)     tadalafil (ADCIRCA/CIALIS) 20 MG tablet Take 0.5 tablets (10 mg total) by mouth every other day as needed for erectile dysfunction. (Patient not taking: Reported on 10/26/2021) 10 tablet 11   traMADol (ULTRAM) 50 MG tablet Take 2 tablets (100 mg total) by mouth 3 (three) times daily as needed. (Patient not taking: Reported on 10/26/2021) 180 tablet 1   carbidopa-levodopa (SINEMET IR) 25-100 MG tablet Take by mouth.     hydrocortisone 2.5 % lotion APPLY TO AFFECTED AREAS DAILY, AS NEEDED (Patient not taking: Reported  on 10/26/2021)  3   methylPREDNISolone (MEDROL DOSEPAK) 4 MG TBPK tablet Take by mouth. (Patient not taking: Reported on 10/26/2021)     mirabegron ER (MYRBETRIQ) 25 MG TB24 tablet Take by mouth. (Patient not taking: Reported on 10/26/2021)     predniSONE (DELTASONE) 5 MG tablet prednisone 5 mg tablets in a dose pack (Patient not taking: Reported on 10/26/2021)     sulfamethoxazole-trimethoprim (BACTRIM DS) 800-160 MG tablet Take 1 tablet by mouth 2 (two) times daily. (Patient not taking: Reported on 10/26/2021) 14 tablet 0   Zoster Vaccine Adjuvanted [facility]) injection      No facility-administered medications prior to visit.    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  Testosterone level is high.       Relevant Medications   Calcium Carb-Cholecalciferol 600-20 MG-MCG CHEW   Routine general medical examination at a health care facility    age appropriate education and counseling updated, referrals for preventative services and immunizations addressed, dietary and smoking counseling addressed, most recent labs reviewed.  I have personally reviewed and have noted:   1) the patient's medical and social history 2) The pt's use of alcohol, tobacco, and illicit drugs 3) The patient's current medications and supplements 4) Functional ability including ADL's, fall risk, home safety risk, hearing  and visual impairment 5) Diet and physical activities 6) Evidence for depression or mood disorder 7) The patient's height, weight, and BMI have been recorded in the chart     I have made referrals, and provided counseling and education based on review of the above      Other Visit Diagnoses     Prostate cancer screening    -  Primary   Relevant Orders   PSA, Medicare  Moderate mixed hyperlipidemia not requiring statin therapy       Relevant Orders   Lipid panel   History of hip fracture       Relevant Orders   DG Bone Density       I have discontinued [facility] L. Hiller's methylPREDNISolone, predniSONE, carbidopa-levodopa, mirabegron ER, Shingrix, and sulfamethoxazole-trimethoprim. I am also having him maintain his multivitamin, trihexyphenidyl, Eszopiclone, ALPRAZolam, clonazePAM, CVS Acetaminophen Ex St, Eliquis, Lactulose, ibuprofen, traMADol, tadalafil, ketoconazole, diclofenac Sodium, hydrocortisone, lidocaine, oxyCODONE, polyethylene glycol powder, econazole nitrate, fluconazole, meloxicam, amantadine, calcium carbonate, DSS, Folic Acid-Cholecalciferol, loperamide, Tri-Mix, triamcinolone cream, and Calcium Carb-Cholecalciferol.  No orders of the defined types were placed in this encounter.   Medications Discontinued During This Encounter  Medication Reason   carbidopa-levodopa (SINEMET IR) 25-100 MG tablet    hydrocortisone 2.5 % lotion    methylPREDNISolone (MEDROL DOSEPAK) 4 MG TBPK tablet    mirabegron ER (MYRBETRIQ) 25 MG TB24 tablet Cost of medication   predniSONE (DELTASONE) 5 MG tablet    sulfamethoxazole-trimethoprim (BACTRIM DS) 800-160 MG tablet    Zoster Vaccine Adjuvanted [facility]) injection     Follow-up: No follow-ups on file.   Crecencio Mc, MD

## 2021-10-27 LAB — COMPREHENSIVE METABOLIC PANEL
ALT: 18 U/L (ref 0–53)
AST: 23 U/L (ref 0–37)
Albumin: 4.3 g/dL (ref 3.5–5.2)
Alkaline Phosphatase: 43 U/L (ref 39–117)
BUN: 26 mg/dL — ABNORMAL HIGH (ref 6–23)
CO2: 32 mEq/L (ref 19–32)
Calcium: 10 mg/dL (ref 8.4–10.5)
Chloride: 102 mEq/L (ref 96–112)
Creatinine, Ser: 1.27 mg/dL (ref 0.40–1.50)
GFR: 59.22 mL/min — ABNORMAL LOW (ref >60.00)
Glucose, Bld: 64 mg/dL — ABNORMAL LOW (ref 70–99)
Potassium: 4.1 mEq/L (ref 3.5–5.1)
Sodium: 138 mEq/L (ref 135–145)
Total Bilirubin: 0.4 mg/dL (ref 0.2–1.2)
Total Protein: 7.3 g/dL (ref 6.0–8.3)

## 2021-10-27 LAB — LIPID PANEL
Cholesterol: 200 mg/dL (ref 0–200)
HDL: 49.5 mg/dL (ref >39.00)
LDL Cholesterol: 138 mg/dL — ABNORMAL HIGH (ref 0–99)
NonHDL: 150.38
Total CHOL/HDL Ratio: 4
Triglycerides: 63 mg/dL (ref 0.0–149.0)
VLDL: 12.6 mg/dL (ref 0.0–40.0)

## 2021-10-27 LAB — PSA, MEDICARE: PSA: 1.5 ng/ml (ref 0.10–4.00)

## 2021-11-10 ENCOUNTER — Encounter: Payer: Self-pay | Admitting: Internal Medicine

## 2021-11-14 NOTE — Telephone Encounter (Signed)
Spoke with Marcelino Duster at the [facility] she will be faxing a letter with recommendations for further testing for patient. ?

## 2021-11-21 ENCOUNTER — Other Ambulatory Visit: Payer: Self-pay

## 2021-11-21 ENCOUNTER — Telehealth: Payer: Self-pay

## 2021-11-21 ENCOUNTER — Encounter: Payer: Self-pay | Admitting: Internal Medicine

## 2021-11-21 ENCOUNTER — Encounter: Payer: Self-pay | Admitting: Emergency Medicine

## 2021-11-21 ENCOUNTER — Emergency Department
Admission: EM | Admit: 2021-11-21 | Discharge: 2021-11-21 | Disposition: A | Discharge status: Home / Self Care | Payer: Medicare Other | Attending: Emergency Medicine | Admitting: Emergency Medicine

## 2021-11-21 DIAGNOSIS — N481 Balanitis: Secondary | ICD-10-CM | POA: Insufficient documentation

## 2021-11-21 DIAGNOSIS — N4889 Other specified disorders of penis: Secondary | ICD-10-CM | POA: Diagnosis present

## 2021-11-21 MED ORDER — CLOTRIMAZOLE-BETAMETHASONE 1-0.05 % EX CREA: TOPICAL_CREAM | CUTANEOUS | 1 refills | Status: DC

## 2021-11-21 MED ORDER — AMOXICILLIN-POT CLAVULANATE 875-125 MG PO TABS: 1.0000 | ORAL_TABLET | Freq: Two times a day (BID) | ORAL | 0 refills | Status: DC

## 2021-11-21 MED ORDER — AMOXICILLIN-POT CLAVULANATE 875-125 MG PO TABS
1.0000 | ORAL_TABLET | Freq: Once | ORAL | Status: AC
  Administered 2021-11-21: 1 via ORAL
  Filled 2021-11-21: qty 1

## 2021-11-21 NOTE — Telephone Encounter (Signed)
I spoke with patient in regards to tomorrow mornings visit & after speaking with him I advised ED ASAP. Pt stated that he woke up yesterday morning with his penis swollen twice it's size. It is very painful & only gets relief not moving. He said that he has no idea what is causing this. I believe patient's sounding in pain is actually possibly from his dystonia? He said that penis is not red, hot to touch or is having any drainage. He stated that he was okay laying still until wife got there & he said that he would have her take him to ED.  ?

## 2021-11-21 NOTE — Discharge Instructions (Signed)
Please take oral antibiotic as prescribed and apply topical antifungal cream twice daily.  If no improvement in 2 to 3 days please follow-up with primary care provider.  If any increasing pain, swelling or inability to retract the foreskin over the head of the penis in either direction please return to the ER. ?

## 2021-11-21 NOTE — ED Triage Notes (Signed)
Pt to ED via POV with c/o the tip of he penis being swollen, does not hurt to urinate. No blood seen in urine. It has been swollen for the last two days.  ?

## 2021-11-21 NOTE — ED Provider Notes (Signed)
?[facility] EMERGENCY DEPARTMENT ?Provider Note ? ? ?CSN: [id number] ?Arrival date & time: 11/21/21  1821 ? ?  ? ?History ? ?Chief Complaint  ?Patient presents with  ? Groin Swelling  ? ? ?Nicholas Singleton is a 66 y.o. male.  Presents to the emergency department evaluation swelling to the foreskin and head of the penis.  Swelling is been present for 2 days.  Mild pain.  He is able to fully retract and pull the foreskin over the head of the penis.  No issues with urinary flow.  He denies any fevers, penile discharge.  No scrotal pain, swelling or tenderness.  He is not diabetic.  No recent infections.  He has had this happen in the past responded well with topical creams. ? ?HPI ? ?  ? ?Home Medications ?Prior to Admission medications   ?Medication Sig Start Date End Date Taking? Authorizing Provider  ?amoxicillin-clavulanate (AUGMENTIN) 875-125 MG tablet Take 1 tablet by mouth every 12 (twelve) hours for 7 days. 11/21/21 11/28/21 Yes Evon Slack, PA-C  ?clotrimazole-betamethasone (LOTRISONE) cream Apply to affected area 2 times daily 11/21/21 11/21/22 Yes Evon Slack, PA-C  ?ALPRAZolam (XANAX) 0.5 MG tablet Take 0.25 mg by mouth as needed.     [provider]  ?amantadine (SYMMETREL) 100 MG capsule Take by mouth.    [provider]  ?Calcium Carb-Cholecalciferol 600-20 MG-MCG CHEW Chew by mouth.    [provider]  ?calcium carbonate (OS-CAL) 1250 (500 Ca) MG chewable tablet Chew by mouth.    [provider]  ?clonazePAM (KLONOPIN) 0.5 MG tablet Take 1 tablet by mouth 4 (four) times daily as needed. 07/24/16   [provider]  ?CVS ACETAMINOPHEN EX ST 500 MG tablet TAKE 2 TABLETS (1,000 MG TOTAL) BY MOUTH EVERY 8 (EIGHT) HOURS 02/13/18   [provider]  ?diclofenac Sodium (VOLTAREN) 1 % GEL diclofenac 1 % topical gel ?Patient not taking: Reported on 10/26/2021    [provider]  ?Docusate Sodium (DSS) 100 MG CAPS Take by  mouth. ?Patient not taking: Reported on 10/26/2021    [provider]  ?econazole nitrate 1 % cream Apply topically daily. 05/05/20   [provider]  ?ELIQUIS 2.5 MG TABS tablet TAKE 1 TABLET (2.5 MG TOTAL) BY MOUTH EVERY 12 (TWELVE) HOURS ?Patient not taking: Reported on 10/26/2021 02/14/18   [provider]  ?Eszopiclone 3 MG TABS Take 3 mg by mouth at bedtime. Take immediately before bedtime ?Patient not taking: Reported on 10/26/2021    [provider]  ?fluconazole (DIFLUCAN) 100 MG tablet Take one tablet by mouth twice daily x one week. Repeat monthly x 3. ?Patient not taking: Reported on 10/26/2021 05/05/20   [provider]  ?Folic Acid-Cholecalciferol 02-7590 MG-UNIT TABS Take by mouth. ?Patient not taking: Reported on 10/26/2021    [provider]  ?hydrocortisone 2.5 % cream APPLY TO THE FACE, SCALP AND CHEST ONCE DAILY 11/24/19   [provider]  ?ibuprofen (ADVIL,MOTRIN) 800 MG tablet Take 800 mg by mouth every 6 (six) hours as needed. for pain 05/01/18   [provider]  ?ketoconazole (NIZORAL) 2 % shampoo PLEASE SEE ATTACHED FOR DETAILED DIRECTIONS 09/24/19   [provider]  ?Lactulose 20 GM/30ML SOLN Take 30 mLs (20 g total) by mouth every 6 (six) hours as needed. To relieve constipatioin ?Patient not taking: Reported on 10/26/2021 02/27/18   Sherlene Shams, MD  ?lidocaine (LIDODERM) 5 % lidocaine 5 % topical patch ?Patient not  taking: Reported on 10/26/2021    [provider]  ?loperamide (IMODIUM) 2 MG capsule Take by mouth.    [provider]  ?meloxicam (MOBIC) 15 MG tablet Take 15 mg by mouth daily. ?Patient not taking: Reported on 10/26/2021 04/28/20   [provider]  ?Multiple Vitamin (MULTIVITAMIN) tablet Take 1 tablet by mouth daily.    [provider]  ?oxyCODONE (OXY IR/ROXICODONE) 5 MG immediate release tablet oxycodone 5 mg tablet ?Patient not taking: Reported on 10/26/2021    [provider]  ?Papav-Phentolamine-Alprostadil (TRI-MIX) 150-5-50 MG-MG-MCG SOLR Inject as directed See admin instructions.    [provider]  ?polyethylene glycol powder (GLYCOLAX/MIRALAX) 17 GM/SCOOP powder polyethylene glycol 3350 17 gram oral powder packet ? TAKE 1 PACKET (17 G TOTAL) BY MOUTH ONCE DAILY FOR 3 DAYS MIX IN 4 8OUNCES OF FLUID PRIOR TO TAKING.    [provider]  ?tadalafil (ADCIRCA/CIALIS) 20 MG tablet Take 0.5 tablets (10 mg total) by mouth every other day as needed for erectile dysfunction. ?Patient not taking: Reported on 10/26/2021 01/16/19   Sherlene Shamsullo, Teresa L, MD  ?traMADol (ULTRAM) 50 MG tablet Take 2 tablets (100 mg total) by mouth 3 (three) times daily as needed. ?Patient not taking: Reported on 10/26/2021 10/28/18   Sherlene Shamsullo, Teresa L, MD  ?triamcinolone cream (KENALOG) 0.1 % Apply topically 2 (two) times daily.    [provider]  ?trihexyphenidyl (ARTANE) 2 MG tablet Take 1 mg by mouth 2 (two) times daily with a meal.     [provider]  ?   ? ?Allergies    ?Patient has no known allergies.   ? ?Review of Systems   ?Review of Systems ? ?Physical Exam ?Updated Vital Signs ?Temp 98 ?F (36.7 ?C) (Oral)   Ht 5\' 10"  (1.778 m)   Wt 81.6 kg   BMI 25.83 kg/m?  ?Physical Exam ?Constitutional:   ?   Appearance: He is well-developed.  ?HENT:  ?   Head: Normocephalic and atraumatic.  ?Eyes:  ?   Conjunctiva/sclera: Conjunctivae normal.  ?Cardiovascular:  ?   Rate and Rhythm: Normal rate.  ?Pulmonary:  ?   Effort: Pulmonary effort is normal. No respiratory distress.  ?Genitourinary: ?   Comments: Slight erythema to the glans penis with swelling of the foreskin on the left side, foreskin is able to be easily completely retracted and also pulled over the glans penis with no pain.  Patient is minimally tender to palpation.  There is no ulcerations or wounds.  No scrotal or testicular tenderness/swelling.  No penile discharge.  Where the foreskin attaches to the penile  shaft at the base of the glans penis there is white residue consistent with likely fungal infection. ?Musculoskeletal:     ?   General: Normal range of motion.  ?   Cervical back: Normal range of motion.  ?Skin: ?   General: Skin is warm.  ?   Findings: No rash.  ?Neurological:  ?   General: No focal deficit present.  ?   Mental Status: He is alert and oriented to person, place, and time.  ?Psychiatric:     ?   Mood and Affect: Mood normal.     ?   Behavior: Behavior normal.     ?   Thought Content: Thought content normal.  ? ? ?ED Results / Procedures / Treatments   ?Labs ?(all labs ordered are listed, but only abnormal results are displayed) ?Labs Reviewed - No data to display ? ?EKG ?  None ? ?Radiology ?No results found. ? ?Procedures ?Procedures  ? ? ?Medications Ordered in ED ?Medications  ?amoxicillin-clavulanate (AUGMENTIN) 875-125 MG per tablet 1 tablet (has no administration in time range)  ? ? ?ED Course/ Medical Decision Making/ A&P ?  ?                        ?Medical Decision Making ?Risk ?Prescription drug management. ? ? ?66 year old male with balanitis.  He has swollen inflamed head of the penis with foreskin that is easily retracted back and forth over the head of the penis with no complication.  Patient educated on hygiene, cleansing the penis properly and will apply antifungal cream.  He is also placed on oral antibiotic to cover for any bacterial infection.  Follow-up with PCP in 2 to 3 days if no relief or improvement.  Patient understands signs symptoms return to the ER for. ?Final Clinical Impression(s) / ED Diagnoses ?Final diagnoses:  ?Balanitis  ? ? ?Rx / DC Orders ?ED Discharge Orders   ? ?      Ordered  ?  amoxicillin-clavulanate (AUGMENTIN) 875-125 MG tablet  Every 12 hours       ? 11/21/21 1940  ?  clotrimazole-betamethasone (LOTRISONE) cream       ? 11/21/21 1940  ? ?  ?  ? ?  ? ? ?  ?Evon Slack, PA-C ?11/21/21 1950 ? ?  ?Ward, Layla Maw, DO ?11/21/21 2008 ? ?

## 2021-11-22 ENCOUNTER — Ambulatory Visit: Payer: Medicare Other | Admitting: Adult Health

## 2021-11-22 NOTE — Telephone Encounter (Signed)
Close  

## 2021-11-25 ENCOUNTER — Ambulatory Visit: Payer: Medicare Other | Admitting: Internal Medicine

## 2021-11-25 ENCOUNTER — Other Ambulatory Visit: Payer: Self-pay

## 2021-11-25 ENCOUNTER — Ambulatory Visit (INDEPENDENT_AMBULATORY_CARE_PROVIDER_SITE_OTHER): Payer: Medicare Other | Admitting: Internal Medicine

## 2021-11-25 ENCOUNTER — Encounter: Payer: Self-pay | Admitting: Internal Medicine

## 2021-11-25 DIAGNOSIS — G259 Extrapyramidal and movement disorder, unspecified: Secondary | ICD-10-CM

## 2021-11-25 DIAGNOSIS — N481 Balanitis: Secondary | ICD-10-CM | POA: Insufficient documentation

## 2021-11-25 NOTE — Assessment & Plan Note (Addendum)
Evaluated and treated by ED physician with augmentin (empiric coverage ) and clotrimazole-betamethasone cream.   Condition is improving but n ot yet resolved.  Advised to continue  use of cream until completely resolved.  Daily use of Probiotics for  3 weeks advised to reduce risk of C dificile colitis.  ?

## 2021-11-25 NOTE — Assessment & Plan Note (Signed)
[facility] records reviewed.  Additional studies are needed including an LP with a before and after videotape of his walking .  Awaiting records and list of advised procedures which I will try to obtain locally and send result to his neurologist at [facility] clinic for interpretation  ?

## 2021-11-25 NOTE — Progress Notes (Signed)
? ?Subjective:  ?Patient ID: Nicholas Singleton, male    DOB: 06-06-1956  Age: 66 y.o. MRN: [id number] ? ?CC: Diagnoses of Balanitis and Movement disorder were pertinent to this visit. ? ? ?This visit occurred during the SARS-CoV-2 public health emergency.  Safety protocols were in place, including screening questions prior to the visit, additional usage of staff PPE, and extensive cleaning of exam room while observing appropriate contact time as indicated for disinfecting solutions.   ? ?HPI ?Nicholas Singleton presents for  ?Chief Complaint  ?Patient presents with  ? Follow-up  ?  F/u after ED visit for Balanitis. Pt was given Augmentin and Nitrate cream. Pt feeling well now.  ? ?Patient developed redness and swelling of his penile shaft.  Was evaluated in ED.  Skin was retractible by ED physician.  Treatment with augmenint and antifungal/steroid cream given for use 2 times daily.  Wife has been administering cream and notes improved appearannce,  less swelling.  He is not  taking a probiotic,  was not advised to.    No diarrhea thus far from use of augmentin  ? ? ?Outpatient Medications Prior to Visit  ?Medication Sig Dispense Refill  ? ALPRAZolam (XANAX) 0.5 MG tablet Take 0.25 mg by mouth as needed.     ? amantadine (SYMMETREL) 100 MG capsule Take by mouth.    ? amoxicillin-clavulanate (AUGMENTIN) 875-125 MG tablet Take 1 tablet by mouth every 12 (twelve) hours for 7 days. 14 tablet 0  ? Calcium Carb-Cholecalciferol 600-20 MG-MCG CHEW Chew by mouth.    ? clonazePAM (KLONOPIN) 0.5 MG tablet Take 1 tablet by mouth 4 (four) times daily as needed.    ? [employer] ACETAMINOPHEN EX ST 500 MG tablet TAKE 2 TABLETS (1,000 MG TOTAL) BY MOUTH EVERY 8 (EIGHT) HOURS  0  ? econazole nitrate 1 % cream Apply topically daily.    ? Folic Acid-Cholecalciferol 04-2499 MG-UNIT TABS Take by mouth.    ? hydrocortisone 2.5 % cream APPLY TO THE FACE, SCALP AND CHEST ONCE DAILY    ? ibuprofen (ADVIL,MOTRIN) 800 MG tablet Take 800 mg by mouth every 6  (six) hours as needed. for pain  3  ? ketoconazole (NIZORAL) 2 % shampoo PLEASE SEE ATTACHED FOR DETAILED DIRECTIONS    ? loperamide (IMODIUM) 2 MG capsule Take by mouth.    ? Multiple Vitamin (MULTIVITAMIN) tablet Take 1 tablet by mouth daily.    ? Papav-Phentolamine-Alprostadil (TRI-MIX) 150-5-50 MG-MG-MCG SOLR Inject as directed See admin instructions.    ? polyethylene glycol powder (GLYCOLAX/MIRALAX) 17 GM/SCOOP powder polyethylene glycol 3350 17 gram oral powder packet ? TAKE 1 PACKET (17 G TOTAL) BY MOUTH ONCE DAILY FOR 3 DAYS MIX IN 4 8OUNCES OF FLUID PRIOR TO TAKING.    ? triamcinolone cream (KENALOG) 0.1 % Apply topically 2 (two) times daily.    ? trihexyphenidyl (ARTANE) 2 MG tablet Take 1 mg by mouth 2 (two) times daily with a meal.     ? calcium carbonate (OS-CAL) 1250 (500 Ca) MG chewable tablet Chew by mouth.    ? clotrimazole-betamethasone (LOTRISONE) cream Apply to affected area 2 times daily 15 g 1  ? diclofenac Sodium (VOLTAREN) 1 % GEL diclofenac 1 % topical gel (Patient not taking: Reported on 10/26/2021)    ? Docusate Sodium (DSS) 100 MG CAPS Take by mouth. (Patient not taking: Reported on 10/26/2021)    ? ELIQUIS 2.5 MG TABS tablet TAKE 1 TABLET (2.5 MG TOTAL) BY MOUTH EVERY 12 (TWELVE) HOURS (Patient not  taking: Reported on 10/26/2021)  0  ? Eszopiclone 3 MG TABS Take 3 mg by mouth at bedtime. Take immediately before bedtime (Patient not taking: Reported on 10/26/2021)    ? fluconazole (DIFLUCAN) 100 MG tablet Take one tablet by mouth twice daily x one week. Repeat monthly x 3. (Patient not taking: Reported on 10/26/2021)    ? Lactulose 20 GM/30ML SOLN Take 30 mLs (20 g total) by mouth every 6 (six) hours as needed. To relieve constipatioin (Patient not taking: Reported on 10/26/2021) 236 mL 1  ? lidocaine (LIDODERM) 5 % lidocaine 5 % topical patch (Patient not taking: Reported on 10/26/2021)    ? meloxicam (MOBIC) 15 MG tablet Take 15 mg by mouth daily.    ? oxyCODONE (OXY IR/ROXICODONE) 5 MG  immediate release tablet oxycodone 5 mg tablet (Patient not taking: Reported on 10/26/2021)    ? tadalafil (ADCIRCA/CIALIS) 20 MG tablet Take 0.5 tablets (10 mg total) by mouth every other day as needed for erectile dysfunction. (Patient not taking: Reported on 10/26/2021) 10 tablet 11  ? traMADol (ULTRAM) 50 MG tablet Take 2 tablets (100 mg total) by mouth 3 (three) times daily as needed. (Patient not taking: Reported on 10/26/2021) 180 tablet 1  ? ?No facility-administered medications prior to visit.  ? ? ?Review of Systems; ? ?Patient denies headache, fevers, malaise, unintentional weight loss, skin rash, eye pain, sinus congestion and sinus pain, sore throat, dysphagia,  hemoptysis , cough, dyspnea, wheezing, chest pain, palpitations, orthopnea, edema, abdominal pain, nausea, melena, diarrhea, constipation, flank pain, dysuria, hematuria, urinary  Frequency, nocturia, numbness, tingling, seizures,  Focal weakness, Loss of consciousness,  Tremor, insomnia, depression, anxiety, and suicidal ideation.   ? ? ? ?Objective:  ?BP 122/82 (BP Location: Left Arm, Patient Position: Sitting, Cuff Size: Small)   Pulse 60   Temp 97.6 ?F (36.4 ?C) (Oral)   Ht 5\' 10"  (1.778 m)   Wt 182 lb (82.6 kg)   SpO2 95%   BMI 26.11 kg/m?  ? ?BP Readings from Last 3 Encounters:  ?11/25/21 122/82  ?11/21/21 (!) 167/83  ?10/26/21 102/66  ? ? ?Wt Readings from Last 3 Encounters:  ?11/25/21 182 lb (82.6 kg)  ?11/21/21 180 lb (81.6 kg)  ?10/26/21 180 lb 6.4 oz (81.8 kg)  ? ? ?General appearance: alert, cooperative and appears stated age ?Ears: normal TM's and external ear canals both ears ?Throat: lips, mucosa, and tongue normal; teeth and gums normal ?Neck: no adenopathy, no carotid bruit, supple, symmetrical, trachea midline and thyroid not enlarged, symmetric, no tenderness/mass/nodules ?Back: symmetric, no curvature. ROM normal. No CVA tenderness. ?Lungs: clear to auscultation bilaterally ?Heart: regular rate and rhythm, S1, S2 normal,  no murmur, click, rub or gallop ?Abdomen: soft, non-tender; bowel sounds normal; no masses,  no organomegaly ?Pulses: 2+ and symmetric ?Skin: Skin color, texture, turgor normal. No rashes or lesions ?Lymph nodes: Cervical, supraclavicular, and axillary nodes normal. ? ?No results found for: HGBA1C ? ?Lab Results  ?Component Value Date  ? CREATININE 1.27 10/26/2021  ? CREATININE 1.08 01/27/2018  ? CREATININE 1.22 01/26/2018  ? ? ?Lab Results  ?Component Value Date  ? WBC 7.2 01/27/2018  ? HGB 13.0 01/27/2018  ? HCT 38.8 (L) 01/27/2018  ? PLT 147 (L) 01/27/2018  ? GLUCOSE 64 (L) 10/26/2021  ? CHOL 200 10/26/2021  ? TRIG 63.0 10/26/2021  ? HDL 49.50 10/26/2021  ? LDLDIRECT 155.2 12/29/2013  ? LDLCALC 138 (H) 10/26/2021  ? ALT 18 10/26/2021  ? AST 23 10/26/2021  ?  NA 138 10/26/2021  ? K 4.1 10/26/2021  ? CL 102 10/26/2021  ? CREATININE 1.27 10/26/2021  ? BUN 26 (H) 10/26/2021  ? CO2 32 10/26/2021  ? TSH 0.73 10/25/2016  ? PSA 1.50 10/26/2021  ? ? ?No results found. ? ?Assessment & Plan:  ? ?Problem List Items Addressed This Visit   ? ? Movement disorder  ?  [facility] records reviewed.  Additional studies are needed including an LP with a before and after videotape of his walking .  Awaiting records and list of advised procedures which I will try to obtain locally and [facility] result [facility]  ?  ?  ? Balanitis  ?  Evaluated and treated by ED physician with augmentin (empiric coverage ) and clotrimazole-betamethasone cream.   Condition is improving but n ot yet resolved.  Advised to continue  use of cream until completely resolved.  Daily use of Probiotics for  3 weeks advised to reduce risk of C dificile colitis.  ?  ?  ? ? ?I spent 30 minutes dedicated to the care of this patient on the date of this encounter to include pre-visit review of patient's medical history,  most recent imaging studies, Face-to-face time with the patient , and post visit ordering of testing and  therapeutics.   ? ?Follow-up: No follow-ups on file. ? ? ?Sherlene Shamseresa L Rohini Jaroszewski, MD ? ?Subjective:  ?Patient ID: Nicholas PhillipsCurtis L Singleton, male    DOB: 08/19/1956  Age: 66 y.o. MRN: [id number] ? ?CC: Diagnoses of Balanitis and Movement diso

## 2021-12-01 NOTE — Telephone Encounter (Signed)
Please let  Mr and Mrs Brigner know that the tests his neurologist at the [facility] clinic want done are not standard tests and cannot be done by the local neurologists because insurance will not pay for them .  I would have to make a referral to [facility] , and they will not accept orders from another neurologist;  the entire process of workup will need to start  over.  Would they like to proceed  with a referral to [facility]?   ?

## 2021-12-29 ENCOUNTER — Encounter: Payer: Self-pay | Admitting: Internal Medicine

## 2022-01-02 NOTE — Telephone Encounter (Signed)
I called and spoke with patient's wife. Pt is scheduled to come in & see Dr. Darrick Huntsman tomorrow at 11;30. ?

## 2022-01-03 ENCOUNTER — Encounter: Payer: Self-pay | Admitting: Internal Medicine

## 2022-01-03 ENCOUNTER — Ambulatory Visit (INDEPENDENT_AMBULATORY_CARE_PROVIDER_SITE_OTHER): Payer: Medicare Other | Admitting: Internal Medicine

## 2022-01-03 DIAGNOSIS — L739 Follicular disorder, unspecified: Secondary | ICD-10-CM

## 2022-01-03 MED ORDER — SULFAMETHOXAZOLE-TRIMETHOPRIM 800-160 MG PO TABS: 1.0000 | ORAL_TABLET | Freq: Two times a day (BID) | ORAL | 0 refills | Status: DC

## 2022-01-03 NOTE — Progress Notes (Signed)
? ?Subjective:  ?Patient ID: Nicholas Singleton, male    DOB: [birth year]  Age: 66 y.o. MRN: [id number] ? ?CC: The encounter diagnosis was Folliculitis. ? ? ?This visit occurred during the SARS-CoV-2 public health emergency.  Safety protocols were in place, including screening questions prior to the visit, additional usage of staff PPE, and extensive cleaning of exam room while observing appropriate contact time as indicated for disinfecting solutions.   ? ?HPI ?Nicholas Singleton presents for evaluation of intermittently appearing white spots on his scalp, not present currently  ? ?Chief Complaint  ?Patient presents with  ? Acute Visit  ?  White spots on back of head  ? 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 It will last for several days,  up to 2 weeks, per patient.  It is pruritic , not accompanied by fevers, but the surrounding area becomes erythematous.  She has been applying hydrocortisone cream .  She notes that he does not rinse very well after showering and has switched soaps to [place] for sensitive skin.  He has no histor y of eczema or psoriasis  ? ? ? ?Outpatient Medications Prior to Visit  ?Medication Sig Dispense Refill  ? ALPRAZolam (XANAX) 0.5 MG tablet Take 0.25 mg by mouth as needed.     ? amantadine (SYMMETREL) 100 MG capsule Take by mouth.    ? Calcium Carb-Cholecalciferol 600-20 MG-MCG CHEW Chew by mouth.    ? clonazePAM (KLONOPIN) 0.5 MG tablet Take 1 tablet by mouth 4 (four) times daily as needed.    ? [employer] ACETAMINOPHEN EX ST 500 MG tablet TAKE 2 TABLETS (1,000 MG TOTAL) BY MOUTH EVERY 8 (EIGHT) HOURS  0  ? econazole nitrate 1 % cream Apply topically daily.    ? Folic Acid-Cholecalciferol 09-4998 MG-UNIT TABS Take by mouth.    ? hydrocortisone 2.5 % cream  APPLY TO THE FACE, SCALP AND CHEST ONCE DAILY    ? ibuprofen (ADVIL,MOTRIN) 800 MG tablet Take 800 mg by mouth every 6 (six) hours as needed. for pain  3  ? ketoconazole (NIZORAL) 2 % shampoo PLEASE SEE ATTACHED FOR DETAILED DIRECTIONS    ? loperamide (IMODIUM) 2 MG capsule Take by mouth.    ? Multiple Vitamin (MULTIVITAMIN) tablet Take 1 tablet by mouth daily.    ? Papav-Phentolamine-Alprostadil (TRI-MIX) 150-5-50 MG-MG-MCG SOLR Inject as directed See admin instructions.    ? polyethylene glycol powder (GLYCOLAX/MIRALAX) 17 GM/SCOOP powder polyethylene glycol 3350 17 gram oral powder packet ? TAKE 1 PACKET (17 G TOTAL) BY MOUTH ONCE DAILY FOR 3 DAYS MIX IN 4 8OUNCES OF FLUID PRIOR TO TAKING.    ? triamcinolone cream (KENALOG) 0.1 % Apply topically 2 (two) times daily.    ? trihexyphenidyl (ARTANE) 2 MG tablet Take 1 mg by mouth 2 (two) times daily with a meal.     ? ?No facility-administered medications prior to visit.  ? ? ?Review of Systems; ? ?Patient denies headache, fevers, malaise, unintentional weight loss, skin rash, eye pain, sinus congestion and sinus pain, sore throat, dysphagia,  hemoptysis , cough, dyspnea, wheezing, chest pain, palpitations, orthopnea, edema, abdominal pain, nausea, melena, diarrhea, constipation, flank pain, dysuria, hematuria, urinary  Frequency, nocturia, numbness, tingling, seizures,  Focal weakness,  Loss of consciousness,  Tremor, insomnia, depression, anxiety, and suicidal ideation.   ? ? ? ?Objective:  ?BP 128/78 (BP Location: Left Arm, Patient Position: Sitting, Cuff Size: Normal)   Pulse 79   Temp 97.6 ?F (36.4 ?C) (Oral)   Ht 5\' 10"  (1.778 m)   Wt 183 lb 9.6 oz (83.3 kg)   SpO2 99%   BMI 26.34 kg/m?  ? ?BP Readings from Last 3 Encounters:  ?01/03/22 128/78  ?11/25/21 122/82  ?11/21/21 (!) 167/83  ? ? ?Wt Readings from Last 3 Encounters:  ?01/03/22 183 lb 9.6 oz (83.3 kg)  ?11/25/21 182 lb (82.6 kg)  ?11/21/21 180 lb (81.6 kg)  ? ? ?General appearance: alert,  cooperative and appears stated age ?Ears: normal TM's and external ear canals both ears ?Throat: lips, mucosa, and tongue normal; teeth and gums normal ?Neck: no adenopathy, no carotid bruit, supple, symmetrical, trachea midline and thyroid not enlarged, symmetric, no tenderness/mass/nodules ?Back: symmetric, no curvature. ROM normal. No CVA tenderness. ?Lungs: clear to auscultation bilaterally ?Heart: regular rate and rhythm, S1, S2 normal, no murmur, click, rub or gallop ?Abdomen: soft, non-tender; bowel sounds normal; no masses,  no organomegaly ?Pulses: 2+ and symmetric ?Skin:  occipital scalp with close cut hair,  several scabbed lesions present . No current pustules or lesions ?Lymph nodes: Cervical, supraclavicular, and axillary nodes normal. ? ?No results found for: HGBA1C ? ?Lab Results  ?Component Value Date  ? CREATININE 1.27 10/26/2021  ? CREATININE 1.08 01/27/2018  ? CREATININE 1.22 01/26/2018  ? ? ?Lab Results  ?Component Value Date  ? WBC 7.2 01/27/2018  ? HGB 13.0 01/27/2018  ? HCT 38.8 (L) 01/27/2018  ? PLT 147 (L) 01/27/2018  ? GLUCOSE 64 (L) 10/26/2021  ? CHOL 200 10/26/2021  ? TRIG 63.0 10/26/2021  ? HDL 49.50 10/26/2021  ? LDLDIRECT 155.2 12/29/2013  ? LDLCALC 138 (H) 10/26/2021  ? ALT 18 10/26/2021  ? AST 23 10/26/2021  ? NA 138 10/26/2021  ? K 4.1 10/26/2021  ? CL 102 10/26/2021  ? CREATININE 1.27 10/26/2021  ? BUN 26 (H) 10/26/2021  ? CO2 32 10/26/2021  ? TSH 0.73 10/25/2016  ? PSA 1.50 10/26/2021  ? ? ?No results found. ? ?Assessment & Plan:  ? ?Problem List Items Addressed This Visit   ? ? Folliculitis  ?  Currently resolved. Pictures of last recurrence reviewed .  Will send rx for Septra DS .  Advised to use antibacterial soap and rinse well prior to shaving,  Use septra ds if it occurs.  ? ?  ?  ? ? ?Follow-up: No follow-ups on file. ? ? ?Crecencio Mc, MD ?

## 2022-01-03 NOTE — Assessment & Plan Note (Addendum)
Currently resolved. Pictures of last recurrence reviewed .  Will send rx for Septra DS .  Advised to use antibacterial soap and rinse well prior to shaving,  Use septra ds if it occurs.  ?

## 2022-01-03 NOTE — Patient Instructions (Addendum)
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 ?I recommend using an antibacterial soap (Dial, [employer],  [employer] 2000) on that area of his scalp and RINSING WELL , especially prior to the shaving /cutting ? ?If the rash occurs,  start taking the Septra DS twice daily with food for one week.  You can continue to apply the steroid cream as well.   ? ? ?If you take the antibiotic,  Daily use of Probiotics for  3 weeks advised to reduce risk of C dificile colitis.  (Align,  Culturelle,  or yougurt )  ?

## 2022-01-24 ENCOUNTER — Encounter: Payer: Self-pay | Admitting: Internal Medicine

## 2022-01-27 NOTE — Telephone Encounter (Signed)
Letter has been placed up front for pick up. 

## 2022-02-13 ENCOUNTER — Encounter: Payer: Self-pay | Admitting: Internal Medicine

## 2022-02-14 MED ORDER — AMOXICILLIN-POT CLAVULANATE 875-125 MG PO TABS: 1.0000 | ORAL_TABLET | Freq: Two times a day (BID) | ORAL | 0 refills | Status: AC

## 2022-03-10 ENCOUNTER — Encounter: Payer: Self-pay | Admitting: Emergency Medicine

## 2022-03-10 ENCOUNTER — Ambulatory Visit
Admission: EM | Admit: 2022-03-10 | Discharge: 2022-03-10 | Disposition: A | Discharge status: Home / Self Care | Payer: Medicare Other | Attending: Urgent Care | Admitting: Urgent Care

## 2022-03-10 ENCOUNTER — Encounter: Payer: Self-pay | Admitting: Internal Medicine

## 2022-03-10 DIAGNOSIS — B9689 Other specified bacterial agents as the cause of diseases classified elsewhere: Secondary | ICD-10-CM | POA: Diagnosis not present

## 2022-03-10 DIAGNOSIS — L081 Erythrasma: Secondary | ICD-10-CM

## 2022-03-10 DIAGNOSIS — L089 Local infection of the skin and subcutaneous tissue, unspecified: Secondary | ICD-10-CM

## 2022-03-10 DIAGNOSIS — B3749 Other urogenital candidiasis: Secondary | ICD-10-CM | POA: Diagnosis not present

## 2022-03-10 MED ORDER — NYSTATIN 100000 UNIT/GM EX POWD: 1.0000 | Freq: Three times a day (TID) | CUTANEOUS | 0 refills | Status: DC

## 2022-03-10 MED ORDER — CLINDAMYCIN PHOSPHATE 1 % EX LOTN
TOPICAL_LOTION | Freq: Two times a day (BID) | CUTANEOUS | 0 refills | Status: DC
Start: 2022-03-10 — End: 2023-10-04

## 2022-03-10 MED ORDER — CEPHALEXIN 500 MG PO CAPS: 500.0000 mg | ORAL_CAPSULE | Freq: Four times a day (QID) | ORAL | 0 refills | Status: AC

## 2022-03-10 NOTE — ED Provider Notes (Signed)
Nicholas Singleton    CSN: [id number] Arrival date & time: 03/10/22  1420      History   Chief Complaint Chief Complaint  Patient presents with   Recurrent Skin Infections    HPI Nicholas Singleton is a 66 y.o. male.   The history is provided by the patient and the spouse.  Rash Location:  Pelvis (groin, primarily scrotum and intertrigonous region bilaterally) Quality: draining, itchiness, redness, scaling and weeping   Quality: not painful and not swelling   Severity:  Moderate Onset quality:  Gradual Duration:  3 days Timing:  Constant Progression:  Worsening Chronicity:  Recurrent Relieved by:  Anti-fungal cream (minimal relief with OTC jock itch cream) Associated symptoms: no diarrhea, no fatigue, no fever, no joint pain, no nausea, no throat swelling and no tongue swelling     Past Medical History:  Diagnosis Date   Dysphonia     Patient Active Problem List   Diagnosis Date Noted   Balanitis 11/25/2021   Moderate Lewy body dementia without behavioral disturbance, psychotic disturbance, mood disturbance, or anxiety (HCC) 10/26/2021   Folliculitis 06/02/2021   History of total hip arthroplasty, left 10/07/2020   Primary osteoarthritis of right hip 10/07/2020   Tinea pedis of both feet 05/05/2020   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/2020   Tendonitis involving left hip abductors 01/30/2020   Benzodiazepine dependence (HCC) 10/25/2019   Osteoporosis 11/15/2018   Painful orthopaedic hardware (HCC) 11/15/2018   Tremor of both hands 11/15/2018   Hospital discharge follow-up 02/23/2018   Pathological fracture of left femur due to osteoporosis (HCC) 01/27/2018   Closed left hip fracture (HCC) 01/26/2018   Chronic insomnia 01/26/2018   Hip fracture (HCC) 01/26/2018   Generalized weakness 08/24/2017   Osteoarthritis of knee 04/20/2017   Hoarseness of voice 04/03/2017   Pain of right hip joint 10/26/2016   Loss of weight 02/07/2015   Onychomycosis 11/20/2013   Cognitive  decline 08/26/2013   Dystonia 11/28/2012   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 Authorizing Provider  cephALEXin (KEFLEX) 500 MG capsule Take 1 capsule (500 mg total) by mouth 4 (four) times daily for 7 days. 03/10/22 03/17/22 Yes Arel Tippen L, PA  clindamycin (CLEOCIN T) 1 % lotion Apply topically 2 (two) times daily. 03/10/22  Yes Miray Mancino L, PA  nystatin (MYCOSTATIN/NYSTOP) powder Apply 1 Application topically 3 (three) times daily. 03/10/22  Yes Tien Aispuro L, PA  ALPRAZolam (XANAX) 0.5 MG tablet Take 0.25 mg by mouth as needed.     [provider]  amantadine (SYMMETREL) 100 MG capsule Take by mouth.    [provider]  Calcium Carb-Cholecalciferol 600-20 MG-MCG CHEW Chew by mouth.    [provider]  clonazePAM (KLONOPIN) 0.5 MG tablet Take 1 tablet by mouth 4 (four) times daily as needed. 07/24/16   [provider]  [facility] ACETAMINOPHEN EX ST 500 MG tablet TAKE 2 TABLETS (1,000 MG TOTAL) BY MOUTH EVERY 8 (EIGHT) HOURS 02/13/18   [provider]  Folic Acid-Cholecalciferol 01-387 MG-UNIT TABS Take by mouth.    [provider]  hydrocortisone 2.5 % cream APPLY TO THE FACE, SCALP AND CHEST ONCE DAILY 11/24/19   [provider]  ibuprofen (ADVIL,MOTRIN) 800 MG tablet Take 800 mg by mouth every 6 (six) hours as needed. for pain 05/01/18   [provider]  loperamide (IMODIUM) 2 MG capsule Take by mouth.    [provider]  Multiple Vitamin (MULTIVITAMIN) tablet Take 1 tablet by mouth daily.    [provider]  Papav-Phentolamine-Alprostadil (TRI-MIX) 150-5-50 MG-MG-MCG SOLR Inject as directed See admin instructions.    [provider]  polyethylene glycol powder (GLYCOLAX/MIRALAX) 17 GM/SCOOP powder polyethylene glycol 3350 17 gram oral powder packet  TAKE 1 PACKET (17 G TOTAL) BY MOUTH ONCE DAILY FOR 3 DAYS MIX IN 4 8OUNCES OF FLUID PRIOR TO TAKING.    [provider]  sulfamethoxazole-trimethoprim (BACTRIM DS) 800-160 MG tablet Take 1 tablet by mouth 2 (two) times daily. 01/03/22   Sherlene Shams, MD  triamcinolone cream (KENALOG) 0.1 % Apply topically 2 (two) times daily.    [provider]  trihexyphenidyl (ARTANE) 2 MG tablet Take 1 mg by mouth 2 (two) times daily with a meal.     [provider]    Family History Family History  Problem Relation Age of Onset   Hypertension Mother    Diabetes Mother    Hypertension Father    Diabetes Father    Stroke Father    Cancer Brother 57       prostate ,      Social History Social History   Tobacco Use   Smoking status: Light Smoker    Types: Cigars   Smokeless tobacco: Never   Tobacco comments:    1 cigar a month  Vaping Use   Vaping Use: Never used  Substance Use Topics   Alcohol use: Yes    Alcohol/week: 1.0 standard drink of alcohol    Types: 1 Standard drinks or equivalent per week    Comment: twice a month   Drug use: No     Allergies   Patient has no known allergies.   Review of Systems Review of Systems  Constitutional:  Negative for fatigue and fever.  Gastrointestinal:  Negative for diarrhea and nausea.  Musculoskeletal:  Negative for arthralgias.  Skin:  Positive for rash.  As per HPI   Physical Exam Triage Vital Signs ED Triage Vitals  Enc Vitals Group     BP 03/10/22 1452 119/79     Pulse Rate 03/10/22 1452 75     Resp 03/10/22 1452 16     Temp 03/10/22 1452 99.1 F (37.3 C)     Temp Source 03/10/22 1452 Oral     SpO2 03/10/22 1452 97 %     Weight --       Height --      Head Circumference --      Peak Flow --      Pain Score 03/10/22 1451 0     Pain Loc --      Pain Edu? --      Excl. in GC? --    No data found.  Updated Vital Signs BP 119/79 (BP Location: Left Arm)   Pulse 75   Temp 99.1 F (37.3 C) (Oral)   Resp 16   SpO2 97%   Visual Acuity Right Eye Distance:   Left Eye Distance:   Bilateral Distance:    Right Eye Near:   Left Eye Near:    Bilateral  Near:     Physical Exam Vitals and nursing note reviewed. Exam conducted with a chaperone present.  Constitutional:      General: He is not in acute distress.    Appearance: Normal appearance. He is not ill-appearing or toxic-appearing.  Genitourinary:    Comments: Generalized erythema to bilateral intertriginous region, slight scaling to periphery, mildly raised. There was a thick film of previously applied cream. Base of penis on L has a single erythematous lesion which appears to be draining, most consistent with draining abscess. Skin:    Findings: Erythema and rash present.  Neurological:     Mental Status: He is alert.     Motor: Weakness (pt with chronic weakness, in wheelchair) present.      UC Treatments / Results  Labs (all labs ordered are listed, but only abnormal results are displayed) Labs Reviewed - No data to display  EKG   Radiology No results found.  Procedures Procedures (including critical care time)  Medications Ordered in UC Medications - No data to display  Initial Impression / Assessment and Plan / UC Course  I have reviewed the triage vital signs and the nursing notes.  Pertinent labs & imaging results that were available during my care of the patient were reviewed by me and considered in my medical decision making (see chart for details).     Candidiasis of genitalia - sx most consistent with intertriginous candidiasis, however concern for possible erythrasma as well. Will switch to topical nystatin powder in addition  to clindamycin cream.  Infection of skin of penis - looks like draining abscess. Non-tender. Will start PO cephalexin. F/U with PCP in 1-2 weeks.  Final Clinical Impressions(s) / UC Diagnoses   Final diagnoses:  Erythrasma  Candidiasis of genitalia  Skin infection, bacterial     Discharge Instructions      Karandeep' rash looks consistent most likely with candidiasis, however it is possible that it is developing into erythrasma, which is a bacterial skin infection. Please use to topical antifungal powder and antibiotic cream as prescribed. Use the oral pills 4x/ day for 5-7 days.   Follow up with PCP if symptoms persist   ED Prescriptions     Medication Sig Dispense Auth. Provider   nystatin (MYCOSTATIN/NYSTOP) powder Apply 1 Application topically 3 (three) times daily. 15 g Cobe Viney L, PA   clindamycin (CLEOCIN T) 1 % lotion Apply topically 2 (two) times daily. 60 mL Tollie Canada L, PA   cephALEXin (KEFLEX) 500 MG capsule Take 1 capsule (500 mg total) by mouth 4 (four) times daily for 7 days. 28 capsule Kiarra Kidd L, PA      PDMP not reviewed this encounter.   Maretta Bees, [place] 03/10/22 1555

## 2022-03-10 NOTE — ED Triage Notes (Signed)
Pt presents with itching in his groin area x 3 days

## 2022-03-10 NOTE — Discharge Instructions (Signed)
Nicholas Singleton' rash looks consistent most likely with candidiasis, however it is possible that it is developing into erythrasma, which is a bacterial skin infection. Please use to topical antifungal powder and antibiotic cream as prescribed. Use the oral pills 4x/ day for 5-7 days.   Follow up with PCP if symptoms persist

## 2022-03-21 NOTE — Telephone Encounter (Signed)
Pt scheduled with Claris Che tomorrow at 10.

## 2022-03-22 ENCOUNTER — Ambulatory Visit: Payer: Medicare Other | Admitting: Internal Medicine

## 2022-03-22 ENCOUNTER — Encounter: Payer: Self-pay | Admitting: Family

## 2022-03-22 ENCOUNTER — Ambulatory Visit (INDEPENDENT_AMBULATORY_CARE_PROVIDER_SITE_OTHER): Payer: Medicare Other | Admitting: Family

## 2022-03-22 VITALS — BP 124/82 | HR 84 | Temp 98.1°F | Ht 69.0 in | Wt 180.6 lb

## 2022-03-22 DIAGNOSIS — B372 Candidiasis of skin and nail: Secondary | ICD-10-CM | POA: Insufficient documentation

## 2022-03-22 MED ORDER — TRIAMCINOLONE ACETONIDE 0.025 % EX CREA: 1.0000 | TOPICAL_CREAM | Freq: Two times a day (BID) | CUTANEOUS | 0 refills | Status: AC

## 2022-03-22 MED ORDER — NYSTATIN 100000 UNIT/GM EX POWD: 1.0000 | Freq: Three times a day (TID) | CUTANEOUS | 0 refills | Status: DC

## 2022-03-22 NOTE — Progress Notes (Signed)
Subjective:    Patient ID: Nicholas Singleton, male    DOB: 21-Nov-1955, 66 y.o.   MRN: [id number]  CC: Nicholas Singleton is a 66 y.o. male who presents today for follow up.   HPI: Accompanied by wife Follow up UC visit 03/10/22  Rash has resolved.  Penile lesion resolved. He is here today as he continues to have itching at base of scrotum, towards anus.  No fever, trouble urinating, constipation.  He is using goldbond to keep area dry. He is no longer using nystatin or clindamycin.      Treated for candidiasis of genitalia, erythrasma,abscess   He was started on cephalexin 500 mg 4 times a day, clindamycin 1% topical twice daily.  He was also started on nystatin topical 3 times daily.   HISTORY:  Past Medical History:  Diagnosis Date   Dysphonia    Past Surgical History:  Procedure Laterality Date   TONSILLECTOMY     Family History  Problem Relation Age of Onset   Hypertension Mother    Diabetes Mother    Hypertension Father    Diabetes Father    Stroke Father    Cancer Brother 100       prostate ,      Allergies: Patient has no known allergies. Current Outpatient Medications on File Prior to Visit  Medication Sig Dispense Refill   ALPRAZolam (XANAX) 0.5 MG tablet Take 0.25 mg by mouth as needed.      amantadine (SYMMETREL) 100 MG capsule Take by mouth.     Calcium Carb-Cholecalciferol 600-20 MG-MCG CHEW Chew by mouth.     clindamycin (CLEOCIN T) 1 % lotion Apply topically 2 (two) times daily. 60 mL 0   clonazePAM (KLONOPIN) 0.5 MG tablet Take 1 tablet by mouth 4 (four) times daily as needed.     [facility]S ACETAMINOPHEN EX ST 500 MG tablet TAKE 2 TABLETS (1,000 MG TOTAL) BY MOUTH EVERY 8 (EIGHT) HOURS  0   Folic Acid-Cholecalciferol 02-3845 MG-UNIT TABS Take by mouth.     hydrocortisone 2.5 % cream APPLY TO THE FACE, SCALP AND CHEST ONCE DAILY     ibuprofen (ADVIL,MOTRIN) 800 MG tablet Take 800 mg by mouth every 6 (six) hours as needed. for pain  3   loperamide (IMODIUM) 2 MG  capsule Take by mouth.     Multiple Vitamin (MULTIVITAMIN) tablet Take 1 tablet by mouth daily.     Papav-Phentolamine-Alprostadil (TRI-MIX) 150-5-50 MG-MG-MCG SOLR Inject as directed See admin instructions.     polyethylene glycol powder (GLYCOLAX/MIRALAX) 17 GM/SCOOP powder polyethylene glycol 3350 17 gram oral powder packet  TAKE 1 PACKET (17 G TOTAL) BY MOUTH ONCE DAILY FOR 3 DAYS MIX IN 4 8OUNCES OF FLUID PRIOR TO TAKING.     trihexyphenidyl (ARTANE) 2 MG tablet Take 1 mg by mouth 2 (two) times daily with a meal. Taking 1/4 tablet once a day     No current facility-administered medications on file prior to visit.    Social History   Tobacco Use   Smoking status: Light Smoker    Types: Cigars   Smokeless tobacco: Never   Tobacco comments:    1 cigar a month  Vaping Use   Vaping Use: Never used  Substance Use Topics   Alcohol use: Yes    Alcohol/week: 1.0 standard drink of alcohol    Types: 1 Standard drinks or equivalent per week    Comment: twice a month   Drug use: No    Review  of Systems  Constitutional:  Negative for chills and fever.  Respiratory:  Negative for cough.   Cardiovascular:  Negative for chest pain and palpitations.  Gastrointestinal:  Negative for nausea and vomiting.  Skin:  Negative for rash and wound.      Objective:    BP 124/82 (BP Location: Right Arm, Patient Position: Sitting, Cuff Size: Normal)   Pulse 84   Temp 98.1 F (36.7 C) (Oral)   Ht 5\' 9"  (1.753 m)   Wt 180 lb 9.6 oz (81.9 kg)   SpO2 98%   BMI 26.67 kg/m  BP Readings from Last 3 Encounters:  03/22/22 124/82  03/10/22 119/79  01/03/22 128/78   Wt Readings from Last 3 Encounters:  03/22/22 180 lb 9.6 oz (81.9 kg)  01/03/22 183 lb 9.6 oz (83.3 kg)  11/25/21 182 lb (82.6 kg)    Physical Exam Vitals reviewed.  Constitutional:      Appearance: He is well-developed.  Cardiovascular:     Rate and Rhythm: Regular rhythm.     Heart sounds: Normal heart sounds.  Pulmonary:      Effort: Pulmonary effort is normal. No respiratory distress.     Breath sounds: Normal breath sounds. No wheezing, rhonchi or rales.  Skin:    General: Skin is warm and dry.     Comments: Examined scrotum and no erythematous, scaling lesions. No satellite lesions or excoriations noted. No brown patch, vesicular lesions, discharge. Skin intact.   Neurological:     Mental Status: He is alert.  Psychiatric:        Speech: Speech normal.        Behavior: Behavior normal.        Assessment & Plan:   Problem List Items Addressed This Visit       Musculoskeletal and Integument   Candidal intertrigo - Primary    Reviewed UC note. Rash has resolved. Prior penile lesion resolved.  Reassuring exam. No brown patch lesions to suggest erythrasma at this time.  Pruritus persists and suspect candidal intertrigo based on location and response to nystatin. I have provided triamcinolone cream 0.025% to use in addition to nystatin . Counseled on keeping area dry.  He will let me know he is doing. Close follow up to ensure resolution.       Relevant Medications   triamcinolone (KENALOG) 0.025 % cream   nystatin (MYCOSTATIN/NYSTOP) powder     I have discontinued Nicholas Singleton's triamcinolone cream and sulfamethoxazole-trimethoprim. I am also having him start on triamcinolone. Additionally, I am having him maintain his multivitamin, trihexyphenidyl, ALPRAZolam, clonazePAM, [facility] Acetaminophen Ex St, ibuprofen, hydrocortisone, polyethylene glycol powder, amantadine, Folic Acid-Cholecalciferol, loperamide, Tri-Mix, Calcium Carb-Cholecalciferol, clindamycin, and nystatin.   Meds ordered this encounter  Medications   triamcinolone (KENALOG) 0.025 % cream    Sig: Apply 1 Application topically 2 (two) times daily. Do not use on face    Dispense:  80 g    Refill:  0    Order Specific Question:   Supervising Provider    Answer:   11/27/21 L [[id number]]   nystatin (MYCOSTATIN/NYSTOP) powder    Sig: Apply  1 Application topically 3 (three) times daily.    Dispense:  15 g    Refill:  0    Order Specific Question:   Supervising Provider    Answer:   Nicholas Singleton [[id number]]    Return precautions given.   Risks, benefits, and alternatives of the medications and treatment plan prescribed today were discussed,  and patient expressed understanding.   Education regarding symptom management and diagnosis given to patient on AVS.  Continue to follow with Sherlene Shams, MD for routine health maintenance.   Nicholas Singleton and I agreed with plan.   Rennie Plowman, FNP

## 2022-03-22 NOTE — Patient Instructions (Signed)
Start triamcinolone cream and combine with nystatin powder to treat for candidiasis.   Please let me know if itching doesn't resolve.

## 2022-03-22 NOTE — Assessment & Plan Note (Addendum)
Reviewed UC note. Rash has resolved. Prior penile lesion resolved.  Reassuring exam. No brown patch lesions to suggest erythrasma at this time.  Pruritus persists and suspect candidal intertrigo based on location and response to nystatin. I have provided triamcinolone cream 0.025% to use in addition to nystatin . Counseled on keeping area dry.  He will let me know he is doing. Close follow up to ensure resolution.

## 2022-03-31 ENCOUNTER — Encounter: Payer: Self-pay | Admitting: Internal Medicine

## 2022-04-04 ENCOUNTER — Telehealth: Payer: Self-pay | Admitting: Internal Medicine

## 2022-04-04 NOTE — Telephone Encounter (Signed)
Pt wife came into office to drop off handicap form. Placed in colored folder up front

## 2022-04-26 ENCOUNTER — Encounter: Payer: Self-pay | Admitting: Internal Medicine

## 2022-04-27 IMAGING — NM NM BONE 3 PHASE
2 series · 12 of 12 positions shown · non-contrast
Comparison: None

Correlation: Preoperative hip radiographs 01/26/2018

CLINICAL DATA: LEFT hip pain, total hip replacement surgery in February 2019 question mechanical loosening of LEFT hip prosthesis

EXAM:
NUCLEAR MEDICINE 3-PHASE BONE SCAN
TECHNIQUE: Radionuclide angiographic images, immediate static blood pool
images, and 3-hour delayed static images were obtained of the hips
after intravenous injection of radiopharmaceutical.
RADIOPHARMACEUTICALS:  20.6 mCi Gc-88m MDP IV

[Series 1: flow · 4.14mm/px · 6 of 40 frames shown (1 of 2)]
[frame 4/40  full-range]
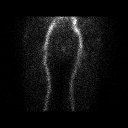
[frame 10/40  full-range]
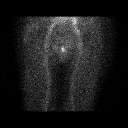
[frame 17/40  full-range]
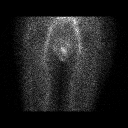
[frame 24/40  full-range]
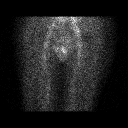
[frame 30/40  full-range]
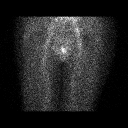
[frame 37/40  full-range]
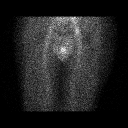

[Series 1: flow · 4.14mm/px · 6 of 40 frames shown (2 of 2)]
[frame 4/40  full-range]
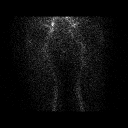
[frame 10/40  full-range]
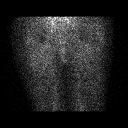
[frame 17/40  full-range]
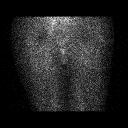
[frame 24/40  full-range]
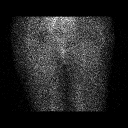
[frame 30/40  full-range]
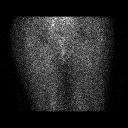
[frame 37/40  full-range]
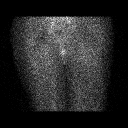

[12 of 12 positions shown; findings below may reference images not displayed]

FINDINGS: Vascular phase: Normal blood flow to the hip regions.

Blood pool phase: Normal blood pool at the hips

Delayed phase: Photopenic defect at LEFT hip from prosthesis.
Minimal uptake of tracer adjacent to the femoral component of the
LEFT hip prosthesis, may be related to relatively recent surgery
from 4141. Additional more focal increased uptake of tracer is seen
at the LEFT greater trochanter, nonspecific, could be postsurgical
or related to either prior or recent trauma. No abnormal tracer
uptake within the pelvis.
IMPRESSION: Normal blood flow and blood pool imaging.

Minimal uptake of tracer adjacent to the femoral component of the
LEFT hip prosthesis, nonspecific, which may be related to surgery in
4141.

Focal increased tracer uptake at LEFT greater trochanter, could be
due to prior surgery or sequela of either prior or recent/recurrent
trauma; correlation with current radiographs recommended.

## 2022-05-15 ENCOUNTER — Encounter: Payer: Self-pay | Admitting: Internal Medicine

## 2022-05-15 ENCOUNTER — Ambulatory Visit (INDEPENDENT_AMBULATORY_CARE_PROVIDER_SITE_OTHER): Payer: Medicare Other | Admitting: Internal Medicine

## 2022-05-15 VITALS — BP 122/78 | HR 76 | Temp 97.5°F | Ht 70.0 in | Wt 178.4 lb

## 2022-05-15 DIAGNOSIS — R197 Diarrhea, unspecified: Secondary | ICD-10-CM

## 2022-05-15 DIAGNOSIS — K5909 Other constipation: Secondary | ICD-10-CM | POA: Diagnosis not present

## 2022-05-15 DIAGNOSIS — K117 Disturbances of salivary secretion: Secondary | ICD-10-CM | POA: Diagnosis not present

## 2022-05-15 DIAGNOSIS — G20 Parkinson's disease: Secondary | ICD-10-CM

## 2022-05-15 DIAGNOSIS — K5900 Constipation, unspecified: Secondary | ICD-10-CM

## 2022-05-15 LAB — COMPREHENSIVE METABOLIC PANEL
ALT: 18 U/L (ref 0–53)
AST: 19 U/L (ref 0–37)
Albumin: 4.1 g/dL (ref 3.5–5.2)
Alkaline Phosphatase: 51 U/L (ref 39–117)
BUN: 23 mg/dL (ref 6–23)
CO2: 30 mEq/L (ref 19–32)
Calcium: 9.7 mg/dL (ref 8.4–10.5)
Chloride: 103 mEq/L (ref 96–112)
Creatinine, Ser: 1.21 mg/dL (ref 0.40–1.50)
GFR: 62.52 mL/min (ref >60.00)
Glucose, Bld: 86 mg/dL (ref 70–99)
Potassium: 3.8 mEq/L (ref 3.5–5.1)
Sodium: 140 mEq/L (ref 135–145)
Total Bilirubin: 0.5 mg/dL (ref 0.2–1.2)
Total Protein: 7.4 g/dL (ref 6.0–8.3)

## 2022-05-15 LAB — CBC WITH DIFFERENTIAL/PLATELET
Basophils Absolute: 0 10*3/uL (ref 0.0–0.1)
Basophils Relative: 0.9 % (ref 0.0–3.0)
Eosinophils Absolute: 0 10*3/uL (ref 0.0–0.7)
Eosinophils Relative: 0.9 % (ref 0.0–5.0)
HCT: 41.5 % (ref 39.0–52.0)
Hemoglobin: 13.3 g/dL (ref 13.0–17.0)
Lymphocytes Relative: 34.2 % (ref 12.0–46.0)
Lymphs Abs: 1.2 10*3/uL (ref 0.7–4.0)
MCHC: 32.1 g/dL (ref 30.0–36.0)
MCV: 89.4 fl (ref 78.0–100.0)
Monocytes Absolute: 0.4 10*3/uL (ref 0.1–1.0)
Monocytes Relative: 10.8 % (ref 3.0–12.0)
Neutro Abs: 1.8 10*3/uL (ref 1.4–7.7)
Neutrophils Relative %: 53.2 % (ref 43.0–77.0)
Platelets: 164 10*3/uL (ref 150.0–400.0)
RBC: 4.64 Mil/uL (ref 4.22–5.81)
RDW: 13.9 % (ref 11.5–15.5)
WBC: 3.5 10*3/uL — ABNORMAL LOW (ref 4.0–10.5)

## 2022-05-15 LAB — TSH: TSH: 0.94 u[IU]/mL (ref 0.35–5.50)

## 2022-05-15 MED ORDER — GLYCOPYRROLATE 1 MG/5ML PO SOLN: 5.0000 mL | Freq: Three times a day (TID) | ORAL | 2 refills | Status: AC

## 2022-05-15 NOTE — Progress Notes (Signed)
Subjective:  Patient ID: Nicholas Singleton, male    DOB: 1956/05/31  Age: 66 y.o. MRN: [id number]  CC: The primary encounter diagnosis was Sialorrhea. Diagnoses of Constipation, unspecified constipation type, Diarrhea, unspecified type, Constipation, chronic, and Parkinson disease (HCC) were also pertinent to this visit.   HPI BELL CAI presents for  Chief Complaint  Patient presents with   excessive salivation    Last 3-4 mnths   1) fecal incontinence  2) excessive salivation  66 yr old male with complicated progressive neurologic syndrome.  He has a  history of spasmodic dysphonia since 2004 who developed progressive gait and cognitive impairment with dysautonomic since 2019. He had moderate to severe parkinsonism . 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 While these findings can be seen in [facility]SA, his PETscan along with other clinical features would be more suggestive of Lewy body disease. He now  presents with sialorrhea and chronic constipation complicated by occasional fecal soiling   Constipation was actually managed with daily miralax but patient did not tolerate having 2 to 3 BMs per day.  Currently taking it every 3rd night ,  but having large BM's  that have been difficult to pass.  The fecal soiling is noted on the days he does not achieve a stool.  The excessive salivation started several months ago.    Outpatient Medications Prior to Visit  Medication Sig Dispense Refill   ALPRAZolam (XANAX) 0.5 MG tablet Take 0.25 mg by mouth as needed.      amantadine (SYMMETREL) 100 MG capsule Take by mouth.     Calcium Carb-Cholecalciferol 600-20 MG-MCG CHEW Chew by mouth.     clonazePAM (KLONOPIN) 0.5 MG tablet Take 1 tablet by mouth 4 (four) times daily as needed.     [facility] ACETAMINOPHEN EX ST 500 MG tablet TAKE 2 TABLETS (1,000 MG TOTAL) BY MOUTH EVERY 8 (EIGHT) HOURS  0    Folic Acid-Cholecalciferol 03-8294 MG-UNIT TABS Take by mouth.     hydrocortisone 2.5 % cream APPLY TO THE FACE, SCALP AND CHEST ONCE DAILY     ibuprofen (ADVIL,MOTRIN) 800 MG tablet Take 800 mg by mouth every 6 (six) hours as needed. for pain  3   loperamide (IMODIUM) 2 MG capsule Take by mouth.     Multiple Vitamin (MULTIVITAMIN) tablet Take 1 tablet by mouth daily.     polyethylene glycol powder (GLYCOLAX/MIRALAX) 17 GM/SCOOP powder polyethylene glycol 3350 17 gram oral powder packet  TAKE 1 PACKET (17 G TOTAL) BY MOUTH ONCE DAILY FOR 3 DAYS MIX IN 4 8OUNCES OF FLUID PRIOR TO TAKING.     triamcinolone (KENALOG) 0.025 % cream Apply 1 Application topically 2 (two) times daily. Do not use on face 80 g 0   clindamycin (CLEOCIN T) 1 % lotion Apply topically 2 (two) times daily. (Patient not taking: Reported on 05/15/2022) 60 mL 0   nystatin (MYCOSTATIN/NYSTOP) powder Apply 1 Application topically 3 (three) times daily. (Patient not taking: Reported on 05/15/2022) 15 g 0   Papav-Phentolamine-Alprostadil (TRI-MIX) 150-5-50 MG-MG-MCG SOLR Inject as directed See admin instructions. (Patient not taking: Reported on 05/15/2022)     trihexyphenidyl (ARTANE) 2 MG tablet Take 1 mg by mouth 2 (two) times daily with a meal. Taking 1/4 tablet once a day (Patient not taking: Reported on 05/15/2022)     No facility-administered medications prior to visit.    Review of Systems;  Patient denies headache, fevers,  malaise, unintentional weight loss, skin rash, eye pain, sinus congestion and sinus pain, sore throat,  hemoptysis , cough, dyspnea, wheezing, chest pain, palpitations, orthopnea, edema, abdominal pain, nausea, melena, diarrhea,  flank pain, dysuria, hematuria, urinary  Frequency, nocturia, numbness, tingling, seizures,  Focal weakness, Loss of consciousness,  Tremor, insomnia, depression, anxiety, and suicidal ideation.      Objective:  BP 122/78 (BP Location: Left Arm, Patient Position: Sitting, Cuff Size:  Normal)   Pulse 76   Temp (!) 97.5 F (36.4 C) (Oral)   Ht 5\' 10"  (1.778 m)   Wt 178 lb 6.4 oz (80.9 kg)   SpO2 97%   BMI 25.60 kg/m   BP Readings from Last 3 Encounters:  05/15/22 122/78  03/22/22 124/82  03/10/22 119/79    Wt Readings from Last 3 Encounters:  05/15/22 178 lb 6.4 oz (80.9 kg)  03/22/22 180 lb 9.6 oz (81.9 kg)  01/03/22 183 lb 9.6 oz (83.3 kg)    General appearance: alert, cooperative and appears stated age Ears: normal TM's and external ear canals both ears Throat: lips, mucosa, and tongue normal; teeth and gums normal Neck: no adenopathy, no carotid bruit, supple, symmetrical, trachea midline and thyroid not enlarged, symmetric, no tenderness/mass/nodules Back: symmetric, no curvature. ROM normal. No CVA tenderness. Lungs: clear to auscultation bilaterally Heart: regular rate and rhythm, S1, S2 normal, no murmur, click, rub or gallop Abdomen: soft, non-tender; bowel sounds normal; no masses,  no organomegaly Pulses: 2+ and symmetric Skin: Skin color, texture, turgor normal. No rashes or lesions Lymph nodes: Cervical, supraclavicular, and axillary nodes normal. Neuro:  awake and interactive with normal mood and affect. Higher cortical functions are normal. Speech is clear without word-finding difficulty or dysarthria. Extraocular movements are intact. Visual fields of both eyes are grossly intact. Sensation to light touch is grossly intact bilaterally of upper and lower extremities. Motor examination shows 4+/5 symmetric hand grip and upper extremity and 5/5 lower extremity strength. There is no pronation or drift. Gait is non-ataxic   No results found for: "HGBA1C"  Lab Results  Component Value Date   CREATININE 1.21 05/15/2022   CREATININE 1.27 10/26/2021   CREATININE 1.08 01/27/2018    Lab Results  Component Value Date   WBC 3.5 (L) 05/15/2022   HGB 13.3 05/15/2022   HCT 41.5 05/15/2022   PLT 164.0 05/15/2022   GLUCOSE 86 05/15/2022   CHOL 200  10/26/2021   TRIG 63.0 10/26/2021   HDL 49.50 10/26/2021   LDLDIRECT 155.2 12/29/2013   LDLCALC 138 (H) 10/26/2021   ALT 18 05/15/2022   AST 19 05/15/2022   NA 140 05/15/2022   K 3.8 05/15/2022   CL 103 05/15/2022   CREATININE 1.21 05/15/2022   BUN 23 05/15/2022   CO2 30 05/15/2022   TSH 0.94 05/15/2022   PSA 1.50 10/26/2021    No results found.  Assessment & Plan:   Problem List Items Addressed This Visit     Constipation, chronic    Causing fecal soiling due to obstipation.  Recommend more aggressive treatment of constipation with addition of stool softener 100 mg qhs on the nights not taking miralax       Parkinson disease ([place])    Now with sialorrhea.  Recommend use of chewing gum and trial of glycopyrrolate 1 mg tid        Other Visit Diagnoses     Sialorrhea    -  Primary   Constipation, unspecified constipation type  Relevant Orders   Comprehensive metabolic panel (Completed)   TSH (Completed)   Diarrhea, unspecified type       Relevant Orders   CBC with Differential/Platelet (Completed)       I spent a total of  30 minutes with this patient in a face to face visit on the date of this encounter reviewing the last office visit with  neurology ,  and post visit ordering of testing and therapeutics.    Follow-up: No follow-ups on file.   Sherlene Shams, MD

## 2022-05-15 NOTE — Assessment & Plan Note (Signed)
Causing fecal soiling due to obstipation.  Recommend more aggressive treatment of constipation with addition of stool softener 100 mg qhs on the nights not taking miralax

## 2022-05-15 NOTE — Assessment & Plan Note (Signed)
Now with sialorrhea.  Recommend use of chewing gum and trial of glycopyrrolate 1 mg tid

## 2022-05-15 NOTE — Patient Instructions (Addendum)
For the fecal soiling,  (due to constipation causing the sphincter to  open):  Try alternating between miralax one night , magnesium  200 mg another  night , 100 mg colace on day 3    Or  miralax every 3 days,  colace 100 mg on the other two nights  You need 60 ounces of water daily  Glycopyrrolate   solution three times daily for the  excessive salivation    Nicholas Singleton:  For your tongue issue:  start a b complex vitamin and increase prilosec to twice daily You can use oragel liquid for the tongue irritation ,  you can also try gargling with salt water

## 2022-05-22 ENCOUNTER — Encounter: Payer: Self-pay | Admitting: Internal Medicine

## 2022-05-22 DIAGNOSIS — J385 Laryngeal spasm: Secondary | ICD-10-CM

## 2022-05-22 DIAGNOSIS — R490 Dysphonia: Secondary | ICD-10-CM

## 2022-05-22 DIAGNOSIS — J383 Other diseases of vocal cords: Secondary | ICD-10-CM

## 2022-06-20 ENCOUNTER — Encounter: Payer: Self-pay | Admitting: Internal Medicine

## 2022-08-30 ENCOUNTER — Encounter: Payer: Self-pay | Admitting: Internal Medicine

## 2022-08-30 DIAGNOSIS — G3183 Dementia with Lewy bodies: Secondary | ICD-10-CM

## 2022-08-30 DIAGNOSIS — J383 Other diseases of vocal cords: Secondary | ICD-10-CM

## 2022-09-28 ENCOUNTER — Encounter: Payer: Self-pay | Admitting: Internal Medicine

## 2022-09-28 DIAGNOSIS — G20A2 Parkinson's disease without dyskinesia, with fluctuations: Secondary | ICD-10-CM

## 2022-09-28 DIAGNOSIS — G3183 Dementia with Lewy bodies: Secondary | ICD-10-CM

## 2022-09-28 DIAGNOSIS — J383 Other diseases of vocal cords: Secondary | ICD-10-CM

## 2022-11-09 ENCOUNTER — Telehealth: Payer: Self-pay | Admitting: Internal Medicine

## 2022-11-09 NOTE — Telephone Encounter (Signed)
Gonzalez to schedule their annual wellness visit. Appointment made for 11/17/2022.  Thank you,  [place] Direct dial  [phone]

## 2022-11-17 ENCOUNTER — Ambulatory Visit (INDEPENDENT_AMBULATORY_CARE_PROVIDER_SITE_OTHER): Payer: Medicare Other

## 2022-11-17 VITALS — Ht 70.0 in | Wt 178.0 lb

## 2022-11-17 DIAGNOSIS — Z0000 Encounter for general adult medical examination without abnormal findings: Secondary | ICD-10-CM

## 2022-11-17 NOTE — Progress Notes (Cosign Needed)
Subjective:   Nicholas Singleton is a 67 y.o. male who presents for an Initial Medicare Annual Wellness Visit.  Review of Systems    No ROS.  Medicare Wellness Virtual Visit.  Visual/audio telehealth visit, UTA vital signs.   See social history for additional risk factors.   Cardiac Risk Factors include: advanced age (>71men, >36 women);male gender     Objective:    Today's Vitals   11/17/22 1438  Weight: 178 lb (80.7 kg)  Height: 5\' 10"  (1.778 m)   Body mass index is 25.54 kg/m.     11/17/2022    2:39 PM 11/21/2021    6:41 PM 01/26/2018    4:12 PM 01/26/2018    1:38 PM  Advanced Directives  Does Patient Have a Medical Advance Directive? Yes No Yes Yes  Type of Paramedic of [place];Living will  [place];Living will [place];Living will  Does patient want to make changes to medical advance directive? No - Patient declined  No - Patient declined   Copy of [place] in Chart? No - copy requested  No - copy requested No - copy requested  Would patient like information on creating a medical advance directive?    No - Patient declined    Current Medications (verified) Outpatient Encounter Medications as of 11/17/2022  Medication Sig   ALPRAZolam (XANAX) 0.5 MG tablet Take 0.25 mg by mouth as needed.    amantadine (SYMMETREL) 100 MG capsule Take by mouth.   Calcium Carb-Cholecalciferol 600-20 MG-MCG CHEW Chew by mouth.   clindamycin (CLEOCIN T) 1 % lotion Apply topically 2 (two) times daily. (Patient not taking: Reported on 05/15/2022)   clonazePAM (KLONOPIN) 0.5 MG tablet Take 1 tablet by mouth 4 (four) times daily as needed.   CVS ACETAMINOPHEN EX ST 500 MG tablet TAKE 2 TABLETS (1,000 MG TOTAL) BY MOUTH EVERY 8 (EIGHT) HOURS   Folic Acid-Cholecalciferol 09-4998 MG-UNIT TABS Take by mouth.   Glycopyrrolate 1 MG/5ML SOLN Take 5 mLs (1 mg total) by mouth in the morning, at noon, and at bedtime.    hydrocortisone 2.5 % cream APPLY TO THE FACE, SCALP AND CHEST ONCE DAILY   ibuprofen (ADVIL,MOTRIN) 800 MG tablet Take 800 mg by mouth every 6 (six) hours as needed. for pain   loperamide (IMODIUM) 2 MG capsule Take by mouth.   Multiple Vitamin (MULTIVITAMIN) tablet Take 1 tablet by mouth daily.   nystatin (MYCOSTATIN/NYSTOP) powder Apply 1 Application topically 3 (three) times daily. (Patient not taking: Reported on 05/15/2022)   Papav-Phentolamine-Alprostadil (TRI-MIX) 150-5-50 MG-MG-MCG SOLR Inject as directed See admin instructions. (Patient not taking: Reported on 05/15/2022)   polyethylene glycol powder (GLYCOLAX/MIRALAX) 17 GM/SCOOP powder polyethylene glycol 3350 17 gram oral powder packet  TAKE 1 PACKET (17 G TOTAL) BY MOUTH ONCE DAILY FOR 3 DAYS MIX IN 4 8OUNCES OF FLUID PRIOR TO TAKING.   triamcinolone (KENALOG) 0.025 % cream Apply 1 Application topically 2 (two) times daily. Do not use on face   trihexyphenidyl (ARTANE) 2 MG tablet Take 1 mg by mouth 2 (two) times daily with a meal. Taking 1/4 tablet once a day (Patient not taking: Reported on 05/15/2022)   No facility-administered encounter medications on file as of 11/17/2022.    Allergies (verified) Patient has no known allergies.   History: Past Medical History:  Diagnosis Date   Dysphonia    Past Surgical History:  Procedure Laterality Date   TONSILLECTOMY  Family History  Problem Relation Age of Onset   Hypertension Mother    Diabetes Mother    Hypertension Father    Diabetes Father    Stroke Father    Cancer Brother 1       prostate ,     Social History   Socioeconomic History   Marital status: Married    Spouse name: Not on file   Number of children: Not on file   Years of education: Not on file   Highest education level: Not on file  Occupational History   Occupation: attorney    Employer: [employer]  Tobacco Use   Smoking status: Light Smoker    Types: Cigars   Smokeless tobacco: Never   Tobacco  comments:    1 cigar a month  Vaping Use   Vaping Use: Never used  Substance and Sexual Activity   Alcohol use: Yes    Alcohol/week: 1.0 standard drink of alcohol    Types: 1 Standard drinks or equivalent per week    Comment: twice a month   Drug use: No   Sexual activity: Not on file  Other Topics Concern   Not on file  Social History Narrative   Not on file   Social Determinants of Health   Financial Resource Strain: Low Risk  (11/17/2022)   Overall Financial Resource Strain (CARDIA)    Difficulty of Paying Living Expenses: Not hard at all  Food Insecurity: No Food Insecurity (11/17/2022)   Hunger Vital Sign    Worried About Running Out of Food in the Last Year: Never true    Ran Out of Food in the Last Year: Never true  Transportation Needs: No Transportation Needs (11/17/2022)   PRAPARE - Hydrologist (Medical): No    Lack of Transportation (Non-Medical): No  Physical Activity: Sufficiently Active (11/17/2022)   Exercise Vital Sign    Days of Exercise per Week: 5 days    Minutes of Exercise per Session: 60 min  Stress: No Stress Concern Present (11/17/2022)   [place]    Feeling of Stress : Not at all  Social Connections: Unknown (11/17/2022)   Social Connection and Isolation Panel [NHANES]    Frequency of Communication with Friends and Family: More than three times a week    Frequency of Social Gatherings with Friends and Family: More than three times a week    Attends Religious Services: Not on Advertising copywriter or Organizations: Not on file    Attends Archivist Meetings: Not on file    Marital Status: Not on file    Tobacco Counseling Ready to quit: Not Answered Counseling given: Not Answered Tobacco comments: 1 cigar a month   Clinical Intake:  Pre-visit preparation completed: Yes        Diabetes: No  How often do you need to have  someone help you when you read instructions, pamphlets, or other written materials from your doctor or pharmacy?: 1 - Never    Interpreter Needed?: No      Activities of Daily Living    11/17/2022    2:41 PM  In your present state of health, do you have any difficulty performing the following activities:  Hearing? 0  Vision? 0  Difficulty concentrating or making decisions? 1  Walking or climbing stairs? 0  Dressing or bathing? 1  Comment Family assist as needed  Doing errands, shopping?  1  Preparing Food and eating ? Y  Comment Wife assists. Meal prep. Self feeds.  Using the Toilet? N  In the past six months, have you accidently leaked urine? N  Do you have problems with loss of bowel control? N  Managing your Medications? Y  Comment Wife manages  Managing your Finances? Y  Comment Wife manages  Housekeeping or managing your Housekeeping? Y  Comment Wife manages    Patient Care Team: Crecencio Mc, MD as PCP - General (Internal Medicine)  Indicate any recent Medical Services you may have received from other than [facility] providers in the past year (date may be approximate).     Assessment:   This is a routine wellness examination for [facility].  I connected with  [name] on 11/17/22 by a audio enabled telemedicine application and verified that I am speaking with the correct person using two identifiers.  Patient Location: Home  Provider Location: Office/Clinic  I discussed the limitations of evaluation and management by telemedicine. The patient expressed understanding and agreed to proceed.   Hearing/Vision screen Hearing Screening - Comments:: Patient is able to hear conversational tones without difficulty.  No issues reported.   Vision Screening - Comments:: Followed by [facility] Wears corrective lenses They have seen their ophthalmologist in the last 12 months.    Dietary issues and exercise activities discussed: Current Exercise Habits: Home  exercise routine, Type of exercise: strength training/weights;stretching;treadmill;calisthenics, Time (Minutes): 60, Frequency (Times/Week): 5, Weekly Exercise (Minutes/Week): 300, Intensity: Mild   Goals Addressed             This Visit's Progress    Follow up with Primary Care Provider       As needed       Depression Screen    11/17/2022    2:40 PM 05/15/2022    1:06 PM 03/22/2022   10:21 AM 11/25/2021   11:33 AM 10/26/2021    4:14 PM  PHQ 2/9 Scores  PHQ - 2 Score 0 0 0 0 0    Fall Risk    11/17/2022    2:40 PM 03/22/2022   10:21 AM 01/03/2022   11:46 AM 11/25/2021   11:33 AM 10/26/2021    4:13 PM  Fall Risk   Falls in the past year? 0 1 1 0 1  Number falls in past yr: 0 1 1  1   Injury with Fall? 0 0 0  0  Risk for fall due to :  History of fall(s) History of fall(s);Impaired balance/gait No Fall Risks History of fall(s)  Follow up Falls evaluation completed;Falls prevention discussed Falls evaluation completed Falls evaluation completed Falls evaluation completed Falls evaluation completed    FALL RISK PREVENTION PERTAINING TO THE HOME: Home free of loose throw rugs in walkways, pet beds, electrical cords, etc? No  Adequate lighting in your home to reduce risk of falls? No   ASSISTIVE DEVICES UTILIZED TO PREVENT FALLS: Use of a cane, walker or w/c? Yes , cane  TIMED UP AND GO: Was the test performed? No .   Cognitive Function:    11/17/2022    3:09 PM  MMSE - Mini Mental State Exam  Not completed: Unable to complete  Patient is alert. Followed by Neurology. Last appointment 10/2012. See chart.      Immunizations Immunization History  Administered Date(s) Administered   COVID-19, mRNA, vaccine(Comirnaty)12 years and older 06/16/2022   Fluad Quad(high Dose 65+) 06/16/2022   Hepatitis A, Adult 12/06/1996, 10/10/1998  Influenza Nasal 09/13/2003   Influenza Split 08/31/2013   Influenza Whole 07/05/1996, 10/10/1998, 08/02/1999, 09/10/2000, 07/04/2001,  08/09/2005   Influenza,inj,Quad PF,6+ Mos 09/09/2016, 10/04/2017, 06/18/2018, 05/16/2019   Influenza-Unspecified 06/04/2006, 06/04/2012, 09/04/2015, 06/18/2021   MMR 02/02/1982   OPV 01/03/1987   PFIZER(Purple Top)SARS-COV-2 Vaccination 11/25/2019, 12/16/2019, 01/11/2021   Pfizer Covid-19 Vaccine Bivalent Booster 10yrs & up 06/14/2021   Td 01/03/1987, 12/07/1996   Tdap 04/18/2012   Typhoid Parenteral, 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 Advised may receive this vaccine at local pharmacy or Health Dept. Aware to provide a copy of the vaccination record if obtained from local pharmacy or Health Dept. Verbalized acceptance and understanding.  Screening Tests Health Maintenance  Topic Date Due   DTaP/Tdap/Td (4 - Td or Tdap) 04/18/2022   Pneumonia Vaccine 56+ Years old (1 of 2 - PCV) 11/26/2022 (Originally 02/28/1962)   Medicare Annual Wellness (AWV)  11/17/2023   COLONOSCOPY (Pts 45-33yrs Insurance coverage will need to be confirmed)  08/23/2027   INFLUENZA VACCINE  Completed   COVID-19 Vaccine  Completed   Hepatitis C Screening  Completed   Zoster Vaccines- Shingrix  Completed   HPV VACCINES  Aged Out    Health Maintenance Health Maintenance Due  Topic Date Due   DTaP/Tdap/Td (4 - Td or Tdap) 04/18/2022   Lung Cancer Screening: (Low Dose CT Chest recommended if Age 49-80 years, 30 pack-year currently smoking OR have quit w/in 15years.) does not qualify.   Hepatitis C Screening: Completed 12/2015.  Vision Screening: Recommended annual ophthalmology exams for early detection of glaucoma and other disorders of the eye.  Dental Screening: Recommended annual dental exams for proper oral hygiene.  Community Resource Referral / Chronic Care Management: CRR required this visit?  No   CCM required this visit?  No       Plan:     I have personally reviewed and noted the following in the patient's chart:   Medical and social history Use of alcohol, tobacco or illicit drugs  Current medications and supplements including opioid prescriptions. Patient is not currently taking opioid prescriptions. Functional ability and status Nutritional status Physical activity Advanced directives List of other physicians Hospitalizations, surgeries, and ER visits in previous 12 months Vitals Screenings to include cognitive, depression, and falls Referrals and appointments  In addition, I have reviewed and discussed with patient certain preventive protocols, quality metrics, and best practice recommendations. A written personalized care plan for preventive services as well as general preventive health recommendations were provided to patient.     [place], LPN   [id number]     I have reviewed the above information and agree with above.   Deborra Medina, MD

## 2022-11-17 NOTE — Patient Instructions (Addendum)
Nicholas Singleton , Thank you for taking time to come for your Medicare Wellness Visit. I appreciate your ongoing commitment to your health goals. Please review the following plan we discussed and let me know if I can assist you in the future.   These are the goals we discussed:  Goals      Follow up with Primary Care Provider     As needed        This is a list of the screening recommended for you and due dates:  Health Maintenance  Topic Date Due   DTaP/Tdap/Td vaccine (4 - Td or Tdap) 04/18/2022   Pneumonia Vaccine (1 of 2 - PCV) 11/26/2022*   Medicare Annual Wellness Visit  11/17/2023   Colon Cancer Screening  08/23/2027   Flu Shot  Completed   COVID-19 Vaccine  Completed   Hepatitis C Screening: USPSTF Recommendation to screen - Ages 18-79 yo.  Completed   Zoster (Shingles) Vaccine  Completed   HPV Vaccine  Aged Out  *Topic was postponed. The date shown is not the original due date.    Advanced directives: End of life planning; Advance aging; Advanced directives discussed.  Copy of current HCPOA/Living Will requested.    Conditions/risks identified: none new  Next appointment: Follow up in one year for your annual wellness visit.   Preventive Care 26 Years and Older, Male  Preventive care refers to lifestyle choices and visits with your health care provider that can promote health and wellness. What does preventive care include? A yearly physical exam. This is also called an annual well check. Dental exams once or twice a year. Routine eye exams. Ask your health care provider how often you should have your eyes checked. Personal lifestyle choices, including: Daily care of your teeth and gums. Regular physical activity. Eating a healthy diet. Avoiding tobacco and drug use. Limiting alcohol use. Practicing safe sex. Taking low doses of aspirin every day. Taking vitamin and mineral supplements as recommended by your health care provider. What happens during an annual well  check? The services and screenings done by your health care provider during your annual well check will depend on your age, overall health, lifestyle risk factors, and family history of disease. Counseling  Your health care provider may ask you questions about your: Alcohol use. Tobacco use. Drug use. Emotional well-being. Home and relationship well-being. Sexual activity. Eating habits. History of falls. Memory and ability to understand (cognition). Work and work Statistician. Screening  You may have the following tests or measurements: Height, weight, and BMI. Blood pressure. Lipid and cholesterol levels. These may be checked every 5 years, or more frequently if you are over 85 years old. Skin check. Lung cancer screening. You may have this screening every year starting at age 74 if you have a 30-pack-year history of smoking and currently smoke or have quit within the past 15 years. Fecal occult blood test (FOBT) of the stool. You may have this test every year starting at age 13. Flexible sigmoidoscopy or colonoscopy. You may have a sigmoidoscopy every 5 years or a colonoscopy every 10 years starting at age 33. Prostate cancer screening. Recommendations will vary depending on your family history and other risks. Hepatitis C blood test. Hepatitis B blood test. Sexually transmitted disease (STD) testing. Diabetes screening. This is done by checking your blood sugar (glucose) after you have not eaten for a while (fasting). You may have this done every 1-3 years. Abdominal aortic aneurysm (AAA) screening. You may need this  if you are a current or former smoker. Osteoporosis. You may be screened starting at age 41 if you are at high risk. Talk with your health care provider about your test results, treatment options, and if necessary, the need for more tests. Vaccines  Your health care provider may recommend certain vaccines, such as: Influenza vaccine. This is recommended every  year. Tetanus, diphtheria, and acellular pertussis (Tdap, Td) vaccine. You may need a Td booster every 10 years. Zoster vaccine. You may need this after age 15. Pneumococcal 13-valent conjugate (PCV13) vaccine. One dose is recommended after age 37. Pneumococcal polysaccharide (PPSV23) vaccine. One dose is recommended after age 13. Talk to your health care provider about which screenings and vaccines you need and how often you need them. This information is not intended to replace advice given to you by your health care provider. Make sure you discuss any questions you have with your health care provider. Document Released: 09/17/2015 Document Revised: 05/10/2016 Document Reviewed: 06/22/2015 Elsevier Interactive Patient Education  2017 [employer] Prevention in the Home Falls can cause injuries. They can happen to people of all ages. There are many things you can do to make your home safe and to help prevent falls. What can I do on the outside of my home? Regularly fix the edges of walkways and driveways and fix any cracks. Remove anything that might make you trip as you walk through a door, such as a raised step or threshold. Trim any bushes or trees on the path to your home. Use bright outdoor lighting. Clear any walking paths of anything that might make someone trip, such as rocks or tools. Regularly check to see if handrails are loose or broken. Make sure that both sides of any steps have handrails. Any raised decks and porches should have guardrails on the edges. Have any leaves, snow, or ice cleared regularly. Use sand or salt on walking paths during winter. Clean up any spills in your garage right away. This includes oil or grease spills. What can I do in the bathroom? Use night lights. Install grab bars by the toilet and in the tub and shower. Do not use towel bars as grab bars. Use non-skid mats or decals in the tub or shower. If you need to sit down in the shower, use a  plastic, non-slip stool. Keep the floor dry. Clean up any water that spills on the floor as soon as it happens. Remove soap buildup in the tub or shower regularly. Attach bath mats securely with double-sided non-slip rug tape. Do not have throw rugs and other things on the floor that can make you trip. What can I do in the bedroom? Use night lights. Make sure that you have a light by your bed that is easy to reach. Do not use any sheets or blankets that are too big for your bed. They should not hang down onto the floor. Have a firm chair that has side arms. You can use this for support while you get dressed. Do not have throw rugs and other things on the floor that can make you trip. What can I do in the kitchen? Clean up any spills right away. Avoid walking on wet floors. Keep items that you use a lot in easy-to-reach places. If you need to reach something above you, use a strong step stool that has a grab bar. Keep electrical cords out of the way. Do not use floor polish or wax that makes floors slippery.  If you must use wax, use non-skid floor wax. Do not have throw rugs and other things on the floor that can make you trip. What can I do with my stairs? Do not leave any items on the stairs. Make sure that there are handrails on both sides of the stairs and use them. Fix handrails that are broken or loose. Make sure that handrails are as long as the stairways. Check any carpeting to make sure that it is firmly attached to the stairs. Fix any carpet that is loose or worn. Avoid having throw rugs at the top or bottom of the stairs. If you do have throw rugs, attach them to the floor with carpet tape. Make sure that you have a light switch at the top of the stairs and the bottom of the stairs. If you do not have them, ask someone to add them for you. What else can I do to help prevent falls? Wear shoes that: Do not have high heels. Have rubber bottoms. Are comfortable and fit you  well. Are closed at the toe. Do not wear sandals. If you use a stepladder: Make sure that it is fully opened. Do not climb a closed stepladder. Make sure that both sides of the stepladder are locked into place. Ask someone to hold it for you, if possible. Clearly mark and make sure that you can see: Any grab bars or handrails. First and last steps. Where the edge of each step is. Use tools that help you move around (mobility aids) if they are needed. These include: Canes. Walkers. Scooters. Crutches. Turn on the lights when you go into a dark area. Replace any light bulbs as soon as they burn out. Set up your furniture so you have a clear path. Avoid moving your furniture around. If any of your floors are uneven, fix them. If there are any pets around you, be aware of where they are. Review your medicines with your doctor. Some medicines can make you feel dizzy. This can increase your chance of falling. Ask your doctor what other things that you can do to help prevent falls. This information is not intended to replace advice given to you by your health care provider. Make sure you discuss any questions you have with your health care provider. Document Released: 06/17/2009 Document Revised: 01/27/2016 Document Reviewed: 09/25/2014 Elsevier Interactive Patient Education  2017 [employer].

## 2022-12-18 ENCOUNTER — Emergency Department: Payer: Medicare Other

## 2022-12-18 ENCOUNTER — Encounter: Payer: Self-pay | Admitting: *Deleted

## 2022-12-18 ENCOUNTER — Other Ambulatory Visit: Payer: Self-pay

## 2022-12-18 DIAGNOSIS — R0602 Shortness of breath: Secondary | ICD-10-CM | POA: Diagnosis present

## 2022-12-18 DIAGNOSIS — R490 Dysphonia: Secondary | ICD-10-CM | POA: Insufficient documentation

## 2022-12-18 DIAGNOSIS — E860 Dehydration: Secondary | ICD-10-CM | POA: Diagnosis not present

## 2022-12-18 LAB — CBC
HCT: 41 % (ref 39.0–52.0)
Hemoglobin: 13.1 g/dL (ref 13.0–17.0)
MCH: 29.3 pg (ref 26.0–34.0)
MCHC: 32 g/dL (ref 30.0–36.0)
MCV: 91.7 fL (ref 80.0–100.0)
Platelets: 141 10*3/uL — ABNORMAL LOW (ref 150–400)
RBC: 4.47 MIL/uL (ref 4.22–5.81)
RDW: 14.4 % (ref 11.5–15.5)
WBC: 5.5 10*3/uL (ref 4.0–10.5)
nRBC: 0 % (ref 0.0–0.2)

## 2022-12-18 LAB — BASIC METABOLIC PANEL
Anion gap: 7 (ref 5–15)
BUN: 22 mg/dL (ref 8–23)
CO2: 28 mmol/L (ref 22–32)
Calcium: 9.4 mg/dL (ref 8.9–10.3)
Chloride: 103 mmol/L (ref 98–111)
Creatinine, Ser: 1.75 mg/dL — ABNORMAL HIGH (ref 0.61–1.24)
GFR, Estimated: 42 mL/min — ABNORMAL LOW (ref >60)
Glucose, Bld: 116 mg/dL — ABNORMAL HIGH (ref 70–99)
Potassium: 3.9 mmol/L (ref 3.5–5.1)
Sodium: 138 mmol/L (ref 135–145)

## 2022-12-18 LAB — TROPONIN I (HIGH SENSITIVITY): Troponin I (High Sensitivity): 3 ng/L (ref <18)

## 2022-12-18 NOTE — ED Triage Notes (Addendum)
Pt to triage via wheelchair.  Pt reports sob.  Pt has spasms of his vocal cords.  Pt taking xanax without relief.  No chest pain.  No n/v/d  pt alert.  Hx lewy body dementia parkinsons disease

## 2022-12-19 ENCOUNTER — Emergency Department
Admission: EM | Admit: 2022-12-19 | Discharge: 2022-12-19 | Disposition: A | Discharge status: Home / Self Care | Payer: Medicare Other | Attending: Emergency Medicine | Admitting: Emergency Medicine

## 2022-12-19 DIAGNOSIS — E860 Dehydration: Secondary | ICD-10-CM

## 2022-12-19 DIAGNOSIS — R490 Dysphonia: Secondary | ICD-10-CM | POA: Diagnosis not present

## 2022-12-19 LAB — URINALYSIS, ROUTINE W REFLEX MICROSCOPIC
Bacteria, UA: NONE SEEN
Bilirubin Urine: NEGATIVE
Glucose, UA: NEGATIVE mg/dL
Hgb urine dipstick: NEGATIVE
Ketones, ur: 5 mg/dL — AB
Leukocytes,Ua: NEGATIVE
Nitrite: NEGATIVE
Protein, ur: 30 mg/dL — AB
Specific Gravity, Urine: 1.021 (ref 1.005–1.030)
pH: 5 (ref 5.0–8.0)

## 2022-12-19 LAB — TROPONIN I (HIGH SENSITIVITY): Troponin I (High Sensitivity): 3 ng/L (ref <18)

## 2022-12-19 MED ORDER — LIDOCAINE VISCOUS HCL 2 % MT SOLN: 15.0000 mL | Freq: Four times a day (QID) | OROMUCOSAL | 0 refills | Status: AC | PRN

## 2022-12-19 MED ORDER — SODIUM CHLORIDE 0.9 % IV BOLUS
1000.0000 mL | Freq: Once | INTRAVENOUS | Status: AC
  Administered 2022-12-19: 1000 mL via INTRAVENOUS

## 2022-12-19 MED ORDER — LIDOCAINE HCL (PF) 2% IJ FOR NEBU
2.5000 mL | Freq: Once | RESPIRATORY_TRACT | Status: AC
  Administered 2022-12-19: 2.5 mL via RESPIRATORY_TRACT
  Filled 2022-12-19: qty 5

## 2022-12-19 NOTE — Discharge Instructions (Signed)
Drink plenty of fluids daily.  Return to the ER for worsening symptoms, persistent vomiting, difficulty breathing or other concerns. °

## 2022-12-19 NOTE — ED Provider Notes (Signed)
[facility] Provider Note    Event Date/Time   First MD Initiated Contact with Patient 12/19/22 [phone]     (approximate)   History   Shortness of Breath   HPI  Nicholas Singleton is a 67 y.o. male who presents to the ED from home with a chief complaint of shortness of breath secondary to spasming of his vocal cords.  Patient has had dysphonia x 20 years.  Prescribed scheduled Clonazepam.  States earlier he could not even speak and now symptoms are improving.  Denies fever/chills, cough, chest pain, abdominal pain, nausea, vomiting or dizziness.  Endorses urinary frequency.     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 disturbance, psychotic disturbance, mood disturbance, or anxiety 10/26/2021   Folliculitis 06/02/2021   History of total hip arthroplasty, left 10/07/2020   Primary osteoarthritis of right hip 10/07/2020   Tinea pedis of both feet 05/05/2020   Parkinson disease 01/30/2020   Tendonitis involving left hip abductors 01/30/2020   Benzodiazepine dependence 10/25/2019   Osteoporosis 11/15/2018   Painful orthopaedic hardware 11/15/2018   Tremor of both hands 11/15/2018   Hospital discharge follow-up 02/23/2018   Pathological fracture of left femur due to osteoporosis 01/27/2018   Closed left hip fracture 01/26/2018   Chronic insomnia 01/26/2018   Hip fracture 01/26/2018   Generalized weakness 08/24/2017   Osteoarthritis of knee 04/20/2017   Hoarseness of voice 04/03/2017   Pain of right hip joint 10/26/2016   Loss of weight 02/07/2015   Onychomycosis 11/20/2013   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 Authorizing Provider  lidocaine (XYLOCAINE) 2 % solution Use as directed 15 mLs in the mouth or throat every 6 (six) hours as needed for up to 5 days (throat spasms). 12/19/22 12/24/22 Yes Irean Hong, MD  ALPRAZolam Prudy Feeler) 0.5 MG tablet Take 0.25 mg by mouth as needed.     [provider]  amantadine (SYMMETREL) 100 MG capsule Take by mouth.    [provider]  Calcium Carb-Cholecalciferol 600-20 MG-MCG CHEW Chew by mouth.    [provider]  clindamycin (CLEOCIN T) 1 % lotion Apply topically 2 (two) times daily. Patient not taking: Reported on 05/15/2022 03/10/22   Guy Sandifer L, PA  clonazePAM (KLONOPIN) 0.5 MG tablet Take 1 tablet by mouth 4 (four) times daily as needed. 07/24/16   [provider]  [facility] ACETAMINOPHEN EX [facility] 500 MG tablet TAKE 2 TABLETS (1,000 MG TOTAL) BY MOUTH EVERY 8 (EIGHT) HOURS 02/13/18   [provider]  Folic Acid-Cholecalciferol 05-6044 MG-UNIT TABS Take by mouth.    [provider]  Glycopyrrolate 1 MG/5ML SOLN Take 5 mLs (1 mg total) by mouth in the morning, at noon, and at bedtime. 05/15/22 08/18/22  Sherlene Shams, MD  hydrocortisone 2.5 % cream APPLY TO THE FACE, SCALP AND CHEST ONCE DAILY 11/24/19  [provider]  ibuprofen (ADVIL,MOTRIN) 800 MG tablet Take 800 mg by mouth every 6 (six) hours as needed. for pain 05/01/18   [provider]  loperamide (IMODIUM) 2 MG capsule Take by mouth.    [provider]  Multiple Vitamin (MULTIVITAMIN) tablet Take 1 tablet by mouth daily.    [provider]  nystatin (MYCOSTATIN/NYSTOP) powder Apply 1 Application topically 3 (three) times daily. Patient not taking: Reported on 05/15/2022 03/22/22   Allegra Grana, FNP  Papav-Phentolamine-Alprostadil (TRI-MIX) 150-5-50 MG-MG-MCG SOLR Inject as directed See admin instructions. Patient not taking: Reported on 05/15/2022    [provider]  polyethylene glycol powder (GLYCOLAX/MIRALAX) 17 GM/SCOOP powder polyethylene glycol 3350 17 gram oral powder packet  TAKE 1 PACKET (17 G TOTAL) BY MOUTH ONCE DAILY FOR 3 DAYS MIX IN 4 8OUNCES OF FLUID PRIOR TO TAKING.    [provider]  triamcinolone (KENALOG) 0.025 % cream Apply 1 Application topically 2 (two) times daily. Do not use on face 03/22/22   Allegra Grana, FNP  trihexyphenidyl (ARTANE) 2 MG tablet Take 1 mg by mouth 2 (two) times daily with a meal. Taking 1/4 tablet once a day Patient not taking: Reported on 05/15/2022    [provider]     Allergies  Patient has no known allergies.   Family History   Family History  Problem Relation Age of Onset   Hypertension Mother    Diabetes Mother    Hypertension Father    Diabetes Father    Stroke Father    Cancer Brother 42       prostate ,       Physical Exam  Triage Vital Signs: ED Triage Vitals  Enc Vitals Group     BP 12/18/22 2157 104/70     Pulse Rate 12/18/22 2156 92     Resp 12/18/22 2156 20     Temp 12/18/22 2156 (!) 97.5 F (36.4 C)     Temp Source 12/18/22 2156 Oral     SpO2 12/18/22 2156 99 %     Weight 12/18/22 2154 176 lb 5.9 oz (80 kg)     Height 12/18/22 2154  (1.778 m)     Head Circumference --      Peak Flow --      Pain Score 12/18/22 2154 0     Pain Loc --      Pain Edu? --      Excl. in GC? --     Updated Vital Signs: BP 104/70 (BP Location: Left Arm)   Pulse 92   Temp 98 F (36.7 C) (Oral)   Resp 20   Ht  (1.778 m)   Wt 80 kg   SpO2 99%   BMI 25.31 kg/m    General: Awake, no distress.  CV:  RRR.  Good peripheral perfusion.  Resp:  Normal effort.  CTAB. Abd:  Nontender.  No distention.  Other:  Raspy voice which is baseline for patient.  Clear  posterior oropharynx without tonsillar swelling, exudates or peritonsillar abscess.  No muffled voice.  Tolerating secretions well.   ED Results / Procedures / Treatments  Labs (all labs ordered are listed, but only abnormal results are displayed) Labs Reviewed  BASIC METABOLIC PANEL - Abnormal; Notable for the following components:      Result Value   Glucose, Bld 116 (*)    Creatinine, Ser 1.75 (*)    GFR, Estimated 42 (*)    All  other components within normal limits  CBC - Abnormal; Notable for the following components:   Platelets 141 (*)    All other components within normal limits  URINALYSIS, ROUTINE W REFLEX MICROSCOPIC - Abnormal; Notable for the following components:   Color, Urine YELLOW (*)    APPearance CLOUDY (*)    Ketones, ur 5 (*)    Protein, ur 30 (*)    All other components within normal limits  TROPONIN I (HIGH SENSITIVITY)  TROPONIN I (HIGH SENSITIVITY)     EKG  ED ECG REPORT I, Arla Boutwell J, the attending physician, personally viewed and interpreted this ECG.   Date: 12/19/2022  EKG Time: 2202  Rate: 92  Rhythm: normal sinus rhythm  Axis: normal  Intervals:none  ST&T Change: nonspecific    RADIOLOGY I have independently visualized and interpreted patient's x-ray as well as noted the radiology interpretation:  X-ray: No acute cardiopulmonary process, low lung volumes  Official radiology report(s): DG Chest 2 View  Result Date: 12/18/2022 CLINICAL DATA:  Shortness of breath. EXAM: CHEST - 2 VIEW COMPARISON:  01/26/2018 FINDINGS: Lung volumes are low.The cardiomediastinal contours are normal. The lungs are clear. Pulmonary vasculature is normal. No consolidation, pleural effusion, or pneumothorax. No acute osseous abnormalities are seen. IMPRESSION: Low lung volumes without acute chest finding. Electronically Signed   By: Narda Rutherford M.D.   On: 12/18/2022 22:30     PROCEDURES:  Critical Care performed: No  Procedures   MEDICATIONS  ORDERED IN ED: Medications  lidocaine 2% "Nebulized" 2.5 mL (2.5 mLs Nebulization Given 12/19/22 0126)  sodium chloride 0.9 % bolus 1,000 mL (0 mLs Intravenous Stopped 12/19/22 0256)     IMPRESSION / MDM / ASSESSMENT AND PLAN / ED COURSE  I reviewed the triage vital signs and the nursing notes.                             67 year old male presenting with shortness of breath secondary to vocal cord spasming, history of dysphonia. Differential includes, but is not limited to, viral syndrome, bronchitis including COPD exacerbation, pneumonia, reactive airway disease including asthma, CHF including exacerbation with or without pulmonary/interstitial edema, pneumothorax, ACS, thoracic trauma, and pulmonary embolism.  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  Patient's presentation is most consistent with acute presentation with potential threat to life or bodily function.  The patient is on the cardiac monitor to evaluate for evidence of arrhythmia and/or significant heart rate changes.  Laboratories a demonstrate normal WBC 5.5, AKI creatinine 1.75 which is increased from prior, initial troponin negative.  Chest x-ray is clear.  Will repeat troponin, add urine given patient's complaints of urinary frequency.  Administer IV fluids, lidocaine nebulizer and reassess.  Clinical Course as of 12/19/22 0449  Tue Dec 19, 2022  1610 Symptoms improved after lidocaine nebulizer.  Awaiting urine sample. [JS]  0259 UA negative for infection.  Strict return precautions given.  Patient and spouse verbalized understanding agree with plan of care. [JS]  0308 Sent as needed viscous lidocaine oral prescription to patient's pharmacy. [JS]    Clinical Course User Index [JS] Irean Hong, MD     FINAL CLINICAL IMPRESSION(S) / ED DIAGNOSES   Final diagnoses:  Dysphonia  Dehydration     Rx / DC Orders   ED  Discharge Orders          Ordered  lidocaine (XYLOCAINE) 2 % solution  Every 6 hours PRN        12/19/22 0306             Note:  This document was prepared using Dragon voice recognition software and may include unintentional dictation errors.   Irean Hong, MD 12/19/22 [phone]

## 2022-12-22 ENCOUNTER — Telehealth: Payer: Self-pay

## 2022-12-22 NOTE — Telephone Encounter (Signed)
     Patient  visit on 4/16  at [facility]    Have you been able to follow up with your primary care physician? Yes   The patient was or was not able to obtain any needed medicine or equipment. Yes   Are there diet recommendations that you are having difficulty following? Na   Patient expresses understanding of discharge instructions and education provided has no other needs at this time.  Yes      Nicholas Singleton [facility] Guide, [place] [phone] [address], [place], [place] [address] Phone: [phone] Email: [email or web address]    

## 2023-04-10 ENCOUNTER — Telehealth: Payer: Self-pay | Admitting: Internal Medicine

## 2023-04-10 NOTE — Telephone Encounter (Signed)
Pt wife called in stating that they both tested positive for Covid, however, she feels better now but she was wondering if Dr. Darrick Huntsman can sent P[facility]lovid over to [facility] [address] for him?

## 2023-04-10 NOTE — Telephone Encounter (Signed)
Pt's wife is aware and gave a verbal understanding.

## 2023-04-10 NOTE — Telephone Encounter (Signed)
Spoke with pt's wife and she stated that they went on a trip last week. Friday pt started having a runny nose and a slight cough so they tested for covid and was positive. She stated that pt is still has the runny nose and slight cough. She was wondering if pt needs the antiviral due to him having the lewy body disease.

## 2023-10-01 ENCOUNTER — Emergency Department (HOSPITAL_COMMUNITY): Payer: Medicare Other

## 2023-10-01 ENCOUNTER — Encounter (HOSPITAL_COMMUNITY): Payer: Self-pay | Admitting: Emergency Medicine

## 2023-10-01 ENCOUNTER — Emergency Department (HOSPITAL_COMMUNITY)
Admission: EM | Admit: 2023-10-01 | Discharge: 2023-10-01 | Disposition: A | Discharge status: Home / Self Care | Payer: Medicare Other | Attending: Emergency Medicine | Admitting: Emergency Medicine

## 2023-10-01 ENCOUNTER — Other Ambulatory Visit: Payer: Self-pay

## 2023-10-01 DIAGNOSIS — R55 Syncope and collapse: Secondary | ICD-10-CM | POA: Diagnosis present

## 2023-10-01 DIAGNOSIS — G20C Parkinsonism, unspecified: Secondary | ICD-10-CM | POA: Insufficient documentation

## 2023-10-01 LAB — CBC WITH DIFFERENTIAL/PLATELET
Abs Immature Granulocytes: 0.02 10*3/uL (ref 0.00–0.07)
Basophils Absolute: 0 10*3/uL (ref 0.0–0.1)
Basophils Relative: 1 %
Eosinophils Absolute: 0.1 10*3/uL (ref 0.0–0.5)
Eosinophils Relative: 1 %
HCT: 42.7 % (ref 39.0–52.0)
Hemoglobin: 13.3 g/dL (ref 13.0–17.0)
Immature Granulocytes: 0 %
Lymphocytes Relative: 23 %
Lymphs Abs: 1.2 10*3/uL (ref 0.7–4.0)
MCH: 29.1 pg (ref 26.0–34.0)
MCHC: 31.1 g/dL (ref 30.0–36.0)
MCV: 93.4 fL (ref 80.0–100.0)
Monocytes Absolute: 0.4 10*3/uL (ref 0.1–1.0)
Monocytes Relative: 9 %
Neutro Abs: 3.3 10*3/uL (ref 1.7–7.7)
Neutrophils Relative %: 66 %
Platelets: 158 10*3/uL (ref 150–400)
RBC: 4.57 MIL/uL (ref 4.22–5.81)
RDW: 14.7 % (ref 11.5–15.5)
WBC: 5 10*3/uL (ref 4.0–10.5)
nRBC: 0 % (ref 0.0–0.2)

## 2023-10-01 LAB — BASIC METABOLIC PANEL
Anion gap: 10 (ref 5–15)
BUN: 18 mg/dL (ref 8–23)
CO2: 25 mmol/L (ref 22–32)
Calcium: 8.9 mg/dL (ref 8.9–10.3)
Chloride: 104 mmol/L (ref 98–111)
Creatinine, Ser: 0.94 mg/dL (ref 0.61–1.24)
GFR, Estimated: >60 mL/min (ref >60)
Glucose, Bld: 96 mg/dL (ref 70–99)
Potassium: 3.4 mmol/L — ABNORMAL LOW (ref 3.5–5.1)
Sodium: 139 mmol/L (ref 135–145)

## 2023-10-01 LAB — CBG MONITORING, ED: Glucose-Capillary: 72 mg/dL (ref 70–99)

## 2023-10-01 MED ORDER — POTASSIUM CHLORIDE CRYS ER 20 MEQ PO TBCR
40.0000 meq | EXTENDED_RELEASE_TABLET | Freq: Once | ORAL | Status: AC
  Administered 2023-10-01: 40 meq via ORAL
  Filled 2023-10-01: qty 2

## 2023-10-01 NOTE — ED Notes (Signed)
Patient resting in hallway bed with visitor at bedside

## 2023-10-01 NOTE — ED Provider Notes (Signed)
[facility] Emergency Department Provider Note MRN:  [id number]  Arrival date & time: 10/02/23     Chief Complaint   Near Syncope   History of Present Illness   Nicholas Singleton is a 68 y.o. year-old male presents to the ED with chief complaint of syncope.  He states that he passed out for about 3-5 minutes while exercising with his personal trainer today.  He was doing seated chest press on a machine.  There was not injury to the chest or fall or dropped weight.  He states that he feels fine now.  No seizure hx.  Hx of parkinson's.  History provided by patient.   Review of Systems  Pertinent positive and negative review of systems noted in HPI.    Physical Exam   Vitals:   10/01/23 1756 10/01/23 2244  BP: (!) 156/97 (!) 155/98  Pulse: (!) 58 (!) 59  Resp: 20 16  Temp: 98.4 F (36.9 C) 98.6 F (37 C)  SpO2: 99% 100%    CONSTITUTIONAL:  non toxic-appearing, NAD NEURO:  Alert and oriented x 3, CN 3-12 grossly intact EYES:  eyes equal and reactive ENT/NECK:  Supple, no stridor  CARDIO:  normal rate, regular rhythm, appears well-perfused  PULM:  No respiratory distress, CTAB GI/GU:  non-distended,  MSK/SPINE:  No gross deformities, no edema, moves all extremities  SKIN:  no rash, atraumatic   *Additional and/or pertinent findings included in MDM below  Diagnostic and Interventional Summary    EKG Interpretation Date/Time:  Monday October 01 2023 18:07:26 EST Ventricular Rate:  61 PR Interval:  157 QRS Duration:  79 QT Interval:  390 QTC Calculation: 393 R Axis:   34  Text Interpretation: Sinus rhythm Low voltage, precordial leads No acute changes No significant change since last tracing Confirmed by Derwood Kaplan [phone]) on 10/01/2023 9:07:23 PM       Labs Reviewed  BASIC METABOLIC PANEL - Abnormal; Notable for the following components:      Result Value   Potassium 3.4 (*)    All other components within normal limits  CBC WITH  DIFFERENTIAL/PLATELET  CBG MONITORING, ED  CBG MONITORING, ED    CT HEAD WO CONTRAST  Final Result      Medications  potassium chloride SA (KLOR-CON M) CR tablet 40 mEq (40 mEq Oral Given 10/01/23 2334)     Procedures  /  Critical Care Procedures  ED Course and Medical Decision Making  I have reviewed the triage vital signs, the nursing notes, and pertinent available records from the EMR.  Social Determinants Affecting Complexity of Care: Patient has no clinically significant social determinants affecting this chief complaint..   ED Course:    Medical Decision Making Patient here with syncopal event that occurred earlier today while he was working out with his Systems analyst.  He was doing chest press on a upright seated machine.  His trainer states that he tilted his head back and looked like he had fallen asleep, but was unable to be awakened for about 3 minutes.  Once patient woke up, he quickly regained normal mentation.  There was no witnessed seizure-like activity.  I have low suspicion for seizure.  EKG does not show any concerning arrhythmias.  Electrolytes are notable for mild hypokalemia of 3.4, I replaced this with p.o. potassium here.  He does not have any evidence of anemia.  He was not hypoglycemic.  He has normal blood pressures during orthostatic testing.  He normally walks  by himself and sometimes with a walker.  He states he feels well now.  I discussed case with Dr. Eudelia Bunch, who agrees with plan for discharge.  Have encouraged the patient to follow-up with his primary care doctor as well as his neurologist.  He asks if anything can be done with regard to the tremor/twitching in his hands, I suspect this is secondary to his Parkinson's disease, and have referred him back to his neurologist for management.  Risk Prescription drug management.         Consultants: No consultations were needed in caring for this patient.   Treatment and Plan: I  considered admission due to patient's initial presentation, but after considering the examination and diagnostic results, patient will not require admission and can be discharged with outpatient follow-up.    Final Clinical Impressions(s) / ED Diagnoses     ICD-10-CM   1. Syncope, unspecified syncope type  R55       ED Discharge Orders     None         Discharge Instructions Discussed with and Provided to Patient:   Discharge Instructions   None      Roxy Horseman, PA-C 10/02/23 0024    Nira Conn, MD 10/02/23 [phone]

## 2023-10-01 NOTE — ED Triage Notes (Signed)
Patient BIB EMS c/o syncope episode. Per report patient passed out after lifting weights with personal trainer. Per report LOC for 3 mins. Patient denies N/V. Hx of Disphonia.   BP

## 2023-10-01 NOTE — ED Provider Triage Note (Signed)
Emergency Medicine Provider Triage Evaluation Note  Nicholas Singleton , a 67 y.o. male  was evaluated in triage.  Pt complains of syncopal episode at the gym.  Patient was doing shoulder press when he reportedly wrecked the weight and had a syncopal episode for 3 minutes witnessed by his trainer.  Patient denies chest pain shortness of breath vision changes headache neck pain new onset weakness postictal state. patient is not on any blood thinners.  Patient is unsure of prodromal symptoms.  Patient denies history of aneurysms or headache.  Review of Systems  Positive:  Negative:   Physical Exam  BP (!) 156/97 (BP Location: Left Arm)   Pulse (!) 58   Temp 98.4 F (36.9 C) (Oral)   Resp 20   Ht 5\' 10"  (1.778 m)   Wt 80 kg   SpO2 99%   BMI 25.31 kg/m  Gen:   Awake, no distress   Resp:  Normal effort  MSK:   Moves extremities without difficulty  Other:  Symmetrical smile, pupils PERRL bilaterally, no slurring of words or facial droop; intentional tremor noted however this is chronic due to his Parkinson's  Medical Decision Making  Medically screening exam initiated at 6:01 PM.  Appropriate orders placed.  Nicholas Singleton was informed that the remainder of the evaluation will be completed by another provider, this initial triage assessment does not replace that evaluation, and the importance of remaining in the ED until their evaluation is complete.  Workup initiated, patient stable at this time.  Do suspect if negative patient may have had a vagal episode from exerting himself.   Netta Corrigan, PA-C 10/01/23 Nicholas Singleton

## 2023-10-01 NOTE — ED Provider Notes (Incomplete)
  [facility] Emergency Department Provider Note MRN:  [id number]  Arrival date & time: 10/01/23     Chief Complaint   Near Syncope   History of Present Illness   Nicholas Singleton is a 68 y.o. year-old male presents to the ED with chief complaint of ***.  {rbhistorian:27070} {RB interpreter (Optional):27221}  Review of Systems  Pertinent positive and negative review of systems noted in HPI.    Physical Exam   Vitals:   10/01/23 1756 10/01/23 2244  BP: (!) 156/97 (!) 155/98  Pulse: (!) 58 (!) 59  Resp: 20 16  Temp: 98.4 F (36.9 C) 98.6 F (37 C)  SpO2: 99% 100%    CONSTITUTIONAL:  ***-appearing, NAD NEURO:  Alert and oriented x 3, CN 3-12 grossly intact*** EYES:  eyes equal and reactive ENT/NECK:  Supple, no stridor *** CARDIO:  ***, ***regular rhythm, appears well-perfused *** PULM:  No respiratory distress***, *** GI/GU:  non-distended, *** MSK/SPINE:  No gross deformities, no edema, moves all extremities *** SKIN:  no rash, atraumatic***   *Additional and/or pertinent findings included in MDM below  Diagnostic and Interventional Summary    EKG Interpretation Date/Time:  Monday October 01 2023 18:07:26 EST Ventricular Rate:  61 PR Interval:  157 QRS Duration:  79 QT Interval:  390 QTC Calculation: 393 R Axis:   34  Text Interpretation: Sinus rhythm Low voltage, precordial leads No acute changes No significant change since last tracing Confirmed by Derwood Kaplan ([id number]) on 10/01/2023 9:07:23 PM      *** Labs Reviewed  BASIC METABOLIC PANEL - Abnormal; Notable for the following components:      Result Value   Potassium 3.4 (*)    All other components within normal limits  CBC WITH DIFFERENTIAL/PLATELET  CBG MONITORING, ED  CBG MONITORING, ED    CT HEAD WO CONTRAST  Final Result      Medications  potassium chloride SA (KLOR-CON M) CR tablet 40 mEq (has no administration in time range)     Procedures  /  Critical  Care Procedures  ED Course and Medical Decision Making  I have reviewed the triage vital signs, the nursing notes, and pertinent available records from the EMR.  Social Determinants Affecting Complexity of Care: Patient {rbSocial Determinants:27067}. {rbsocialsolutions:27068}  ED Course:    Medical Decision Making Risk Prescription drug management.      {rbcpddx (Optional):29772:::1} {rbabdddx (Optional):29773:s::1}  Consultants: {rbconsultants:27072}   Treatment and Plan: {rbadmissionvdc:27069}  {rbattending:27073}  Final Clinical Impressions(s) / ED Diagnoses     ICD-10-CM   1. Syncope, unspecified syncope type  R55       ED Discharge Orders     None         Discharge Instructions Discussed with and Provided to Patient:   Discharge Instructions   None

## 2023-10-01 NOTE — ED Notes (Signed)
Patient d.c with home care instructions with visitors at bedside

## 2023-10-02 ENCOUNTER — Encounter: Payer: Self-pay | Admitting: Internal Medicine

## 2023-10-04 ENCOUNTER — Other Ambulatory Visit: Payer: Self-pay | Admitting: Internal Medicine

## 2023-10-11 MED ORDER — AZITHROMYCIN 250 MG PO TABS: ORAL_TABLET | ORAL | 0 refills | Status: AC

## 2023-11-02 ENCOUNTER — Ambulatory Visit: Payer: Medicare Other | Admitting: Nurse Practitioner

## 2023-11-02 ENCOUNTER — Encounter: Payer: Self-pay | Admitting: Nurse Practitioner

## 2023-11-02 VITALS — BP 128/82 | HR 84 | Temp 98.5°F | Ht 70.0 in

## 2023-11-02 DIAGNOSIS — J101 Influenza due to other identified influenza virus with other respiratory manifestations: Secondary | ICD-10-CM | POA: Diagnosis not present

## 2023-11-02 DIAGNOSIS — R509 Fever, unspecified: Secondary | ICD-10-CM

## 2023-11-02 DIAGNOSIS — R5383 Other fatigue: Secondary | ICD-10-CM

## 2023-11-02 LAB — POCT INFLUENZA A/B
Influenza A, POC: POSITIVE — AB
Influenza B, POC: NEGATIVE

## 2023-11-02 LAB — POC COVID19 BINAXNOW: SARS Coronavirus 2 Ag: NEGATIVE

## 2023-11-02 MED ORDER — OSELTAMIVIR PHOSPHATE 75 MG PO CAPS: 75.0000 mg | ORAL_CAPSULE | Freq: Two times a day (BID) | ORAL | 0 refills | Status: AC

## 2023-11-02 NOTE — Progress Notes (Signed)
 Established Patient Office Visit  Subjective:  Patient ID: Nicholas Singleton, male    DOB: 09/11/1955  Age: 68 y.o. MRN: [id number]  CC:  Chief Complaint  Patient presents with   Urinary Tract Infection    Lethargic & fever   Discussed the use of a AI scribe software for clinical note transcription with the patient, who gave verbal consent to proceed.  HPI  Nicholas Singleton is a 68 year old male with Lewy body disease who presents with fever and increased weakness. He is accompanied by his spouse and sister.  He has experienced fever and increased weakness since Tuesday evening, with difficulty walking, increased sneezing episodes, and requiring assistance for urination in bed. His fever initially recorded at 99.552F rose to 101.52F, responding to Tylenol. By Thursday, during his physical therapy session, his vitals appeared normal, but he remained weak and febrile. His fever decreased overnight with Tylenol, measuring 99.552F in the morning and later 56F. He has not experienced this level of weakness before.  He has had a runny nose for about four weeks, which improved with a Z-Pak two weeks ago but did not completely resolve. He tested negative for COVID on Wednesday. No increase in cough or runny nose over the past few days, although his nose is runnier today than in the last two days. No sore throat, ear problems, or sinus pressure. HPI   Past Medical History:  Diagnosis Date   Dysphonia     Past Surgical History:  Procedure Laterality Date   TONSILLECTOMY      Family History  Problem Relation Age of Onset   Hypertension Mother    Diabetes Mother    Hypertension Father    Diabetes Father    Stroke Father    Cancer Brother 88       prostate ,      Social History   Socioeconomic History   Marital status: Married    Spouse name: Not on file   Number of children: Not on file   Years of education: Not on file   Highest education level: Not on file  Occupational History    Occupation: attorney    Employer: [employer]  Tobacco Use   Smoking status: Former    Types: Cigars   Smokeless tobacco: Never   Tobacco comments:    1 cigar a month  Vaping Use   Vaping status: Never Used  Substance and Sexual Activity   Alcohol use: Not Currently    Alcohol/week: 1.0 standard drink of alcohol    Types: 1 Standard drinks or equivalent per week    Comment: twice a month   Drug use: No   Sexual activity: Not on file  Other Topics Concern   Not on file  Social History Narrative   Not on file   Social Drivers of Health   Financial Resource Strain: Low Risk  (11/17/2022)   Overall Financial Resource Strain (CARDIA)    Difficulty of Paying Living Expenses: Not hard at all  Food Insecurity: No Food Insecurity (11/17/2022)   Hunger Vital Sign    Worried About Running Out of Food in the Last Year: Never true    Ran Out of Food in the Last Year: Never true  Transportation Needs: No Transportation Needs (11/17/2022)   PRAPARE - Administrator, Civil Service (Medical): No    Lack of Transportation (Non-Medical): No  Physical Activity: Sufficiently Active (11/17/2022)   Exercise Vital Sign    Days of  Exercise per Week: 5 days    Minutes of Exercise per Session: 60 min  Stress: No Stress Concern Present (11/17/2022)   [employer] of Occupational Health - Occupational Stress Questionnaire    Feeling of Stress : Not at all  Social Connections: Unknown (11/17/2022)   Social Connection and Isolation Panel [NHANES]    Frequency of Communication with Friends and Family: More than three times a week    Frequency of Social Gatherings with Friends and Family: More than three times a week    Attends Religious Services: Not on file    Active Member of Clubs or Organizations: Not on file    Attends Banker Meetings: Not on file    Marital Status: Not on file  Intimate Partner Violence: Not At Risk (11/17/2022)   Humiliation, Afraid, Rape, and Kick  questionnaire    Fear of Current or Ex-Partner: No    Emotionally Abused: No    Physically Abused: No    Sexually Abused: No     Outpatient Medications Prior to Visit  Medication Sig Dispense Refill   ALPRAZolam (XANAX) 0.5 MG tablet Take 0.25 mg by mouth as needed.      Calcium Carb-Cholecalciferol 600-20 MG-MCG CHEW Chew by mouth.     carbidopa-levodopa (SINEMET IR) 25-100 MG tablet Take 1 tablet by mouth 3 (three) times daily.     clonazePAM (KLONOPIN) 0.5 MG tablet Take 1 tablet by mouth 4 (four) times daily as needed.     [employer] ACETAMINOPHEN EX ST 500 MG tablet TAKE 2 TABLETS (1,000 MG TOTAL) BY MOUTH EVERY 8 (EIGHT) HOURS  0   hydrocortisone 2.5 % cream APPLY TO THE FACE, SCALP AND CHEST ONCE DAILY     ibuprofen (ADVIL,MOTRIN) 800 MG tablet Take 800 mg by mouth every 6 (six) hours as needed. for pain  3   loperamide (IMODIUM) 2 MG capsule Take by mouth.     Multiple Vitamin (MULTIVITAMIN) tablet Take 1 tablet by mouth daily.     polyethylene glycol powder (GLYCOLAX/MIRALAX) 17 GM/SCOOP powder polyethylene glycol 3350 17 gram oral powder packet  TAKE 1 PACKET (17 G TOTAL) BY MOUTH ONCE DAILY FOR 3 DAYS MIX IN 4 8OUNCES OF FLUID PRIOR TO TAKING.     triamcinolone (KENALOG) 0.025 % cream Apply 1 Application topically 2 (two) times daily. Do not use on face 80 g 0   amantadine (SYMMETREL) 100 MG capsule Take by mouth. (Patient not taking: Reported on 11/02/2023)     Folic Acid-Cholecalciferol 12-979 MG-UNIT TABS Take by mouth.     Glycopyrrolate 1 MG/5ML SOLN Take 5 mLs (1 mg total) by mouth in the morning, at noon, and at bedtime. 473 mL 2   trihexyphenidyl (ARTANE) 2 MG tablet Take 1 mg by mouth 2 (two) times daily with a meal. Taking 1/4 tablet once a day (Patient not taking: Reported on 11/02/2023)     No facility-administered medications prior to visit.    No Known Allergies  ROS Review of Systems Negative unless indicated in HPI.    Objective:    Physical Exam  BP 128/82    Pulse 84   Temp 98.5 F (36.9 C)   Ht 5\' 10"  (1.778 m)   SpO2 99%   BMI 25.31 kg/m  Wt Readings from Last 3 Encounters:  10/01/23 176 lb 5.9 oz (80 kg)  12/18/22 176 lb 5.9 oz (80 kg)  11/17/22 178 lb (80.7 kg)     Health Maintenance  Topic Date Due  Pneumonia Vaccine 59+ Years old (1 of 1 - PCV) Never done   DTaP/Tdap/Td (4 - Td or Tdap) 04/18/2022   Medicare Annual Wellness (AWV)  11/17/2023   COVID-19 Vaccine (6 - 2024-25 season) 11/18/2023 (Originally 05/06/2023)   INFLUENZA VACCINE  12/03/2023 (Originally 04/05/2023)   Colonoscopy  08/23/2027   Hepatitis C Screening  Completed   Zoster Vaccines- Shingrix  Completed   HPV VACCINES  Aged Out    There are no preventive care reminders to display for this patient.  Lab Results  Component Value Date   TSH 0.94 05/15/2022   Lab Results  Component Value Date   WBC 5.0 10/01/2023   HGB 13.3 10/01/2023   HCT 42.7 10/01/2023   MCV 93.4 10/01/2023   PLT 158 10/01/2023   Lab Results  Component Value Date   NA 139 10/01/2023   K 3.4 (L) 10/01/2023   CO2 25 10/01/2023   GLUCOSE 96 10/01/2023   BUN 18 10/01/2023   CREATININE 0.94 10/01/2023   BILITOT 0.5 05/15/2022   ALKPHOS 51 05/15/2022   AST 19 05/15/2022   ALT 18 05/15/2022   PROT 7.4 05/15/2022   ALBUMIN 4.1 05/15/2022   CALCIUM 8.9 10/01/2023   ANIONGAP 10 10/01/2023   GFR 62.52 05/15/2022   Lab Results  Component Value Date   CHOL 200 10/26/2021   Lab Results  Component Value Date   HDL 49.50 10/26/2021   Lab Results  Component Value Date   LDLCALC 138 (H) 10/26/2021   Lab Results  Component Value Date   TRIG 63.0 10/26/2021   Lab Results  Component Value Date   CHOLHDL 4 10/26/2021   No results found for: "HGBA1C"    Assessment & Plan:  Influenza A (H1N1) Assessment & Plan: Acute onset of fever, lethargy, and weakness starting on 10/30/2023. Positive flu test on 11/02/2023. -Start antiviral treatment. -If symptoms not improving or  deteriorating, advise to seek emergency care.   Fever, unspecified fever cause -     POCT Urinalysis Dipstick (Automated) -     POCT Influenza A/B -     POC COVID-19 BinaxNow  Other orders -     Oseltamivir Phosphate; Take 1 capsule (75 mg total) by mouth 2 (two) times daily.  Dispense: 10 capsule; Refill: 0    Follow-up: No follow-ups on file.   Kara Dies, NP

## 2023-11-14 DIAGNOSIS — R509 Fever, unspecified: Secondary | ICD-10-CM | POA: Insufficient documentation

## 2023-11-14 DIAGNOSIS — J101 Influenza due to other identified influenza virus with other respiratory manifestations: Secondary | ICD-10-CM | POA: Insufficient documentation

## 2023-11-14 NOTE — Assessment & Plan Note (Signed)
 Acute onset of fever, lethargy, and weakness starting on 10/30/2023. Positive flu test on 11/02/2023. -Start antiviral treatment. -If symptoms not improving or deteriorating, advise to seek emergency care.

## 2023-11-20 ENCOUNTER — Ambulatory Visit: Payer: Medicare Other

## 2023-11-21 ENCOUNTER — Encounter: Payer: Self-pay | Admitting: Internal Medicine

## 2023-11-23 MED ORDER — IPRATROPIUM BROMIDE 0.03 % NA SOLN: 2.0000 | Freq: Two times a day (BID) | NASAL | 12 refills | Status: AC

## 2023-12-10 ENCOUNTER — Ambulatory Visit (INDEPENDENT_AMBULATORY_CARE_PROVIDER_SITE_OTHER)

## 2023-12-10 VITALS — BP 128/82 | HR 84

## 2023-12-10 DIAGNOSIS — Z0000 Encounter for general adult medical examination without abnormal findings: Secondary | ICD-10-CM | POA: Diagnosis not present

## 2023-12-10 NOTE — Progress Notes (Signed)
 Subjective:   Nicholas Singleton is a 68 y.o. who presents for a Medicare Wellness preventive visit.  Visit Complete: Virtual I connected with  [name] on 12/10/23 by a audio enabled telemedicine application and verified that I am speaking with the correct person using two identifiers.  Patient Location: Home  Provider Location: Home Office  I discussed the limitations of evaluation and management by telemedicine. The patient expressed understanding and agreed to proceed.  Vital Signs: Because this visit was a virtual/telehealth visit, some criteria may be missing or patient reported. Any vitals not documented were not able to be obtained and vitals that have been documented are patient reported.  VideoDeclined- This patient declined Librarian, academic. Therefore the visit was completed with audio only.  Persons Participating in Visit: Patient assisted by pt's wife, Marcelino Duster.  AWV Questionnaire: No: Patient Medicare AWV questionnaire was not completed prior to AWV appointment.     Objective:    Today's Vitals   12/10/23 1125  BP: 128/82  Pulse: 84   There is no height or weight on file to calculate BMI.     12/10/2023   11:35 AM 12/18/2022    9:56 PM 11/17/2022    2:39 PM 11/21/2021    6:41 PM 01/26/2018    4:12 PM 01/26/2018    1:38 PM  Advanced Directives  Does Patient Have a Medical Advance Directive? Yes No Yes No Yes Yes  Type of Estate agent of [place];Living will  Healthcare Power of [place];Living will  Healthcare Power of [place];Living will Healthcare Power of [place];Living will  Does patient want to make changes to medical advance directive?   No - Patient declined  No - Patient declined   Copy of Healthcare Power of Attorney in Chart? No - copy requested  No - copy requested  No - copy requested No - copy requested  Would patient like information on creating a medical advance directive?      No - Patient  declined    Current Medications (verified) Outpatient Encounter Medications as of 12/10/2023  Medication Sig   ALPRAZolam (XANAX) 0.5 MG tablet Take 0.25 mg by mouth as needed.    Calcium Carb-Cholecalciferol 600-20 MG-MCG CHEW Chew by mouth.   carbidopa-levodopa (SINEMET IR) 25-100 MG tablet Take 1 tablet by mouth 3 (three) times daily.   clonazePAM (KLONOPIN) 0.5 MG tablet Take 1 tablet by mouth 4 (four) times daily as needed.   [facility] ACETAMINOPHEN EX ST 500 MG tablet TAKE 2 TABLETS (1,000 MG TOTAL) BY MOUTH EVERY 8 (EIGHT) HOURS   donepezil (ARICEPT) 10 MG tablet Take 10 mg by mouth at bedtime.   hydrocortisone 2.5 % cream APPLY TO THE FACE, SCALP AND CHEST ONCE DAILY   ibuprofen (ADVIL,MOTRIN) 800 MG tablet Take 800 mg by mouth every 6 (six) hours as needed. for pain   ipratropium (ATROVENT) 0.03 % nasal spray Place 2 sprays into both nostrils every 12 (twelve) hours.   Multiple Vitamin (MULTIVITAMIN) tablet Take 1 tablet by mouth daily.   polyethylene glycol powder (GLYCOLAX/MIRALAX) 17 GM/SCOOP powder polyethylene glycol 3350 17 gram oral powder packet  TAKE 1 PACKET (17 G TOTAL) BY MOUTH ONCE DAILY FOR 3 DAYS MIX IN 4 8OUNCES OF FLUID PRIOR TO TAKING.   triamcinolone (KENALOG) 0.025 % cream Apply 1 Application topically 2 (two) times daily. Do not use on face   amantadine (SYMMETREL) 100 MG capsule Take by mouth. (Patient not taking: Reported on 12/10/2023)  Folic Acid-Cholecalciferol 01-4097 MG-UNIT TABS Take by mouth. (Patient not taking: Reported on 12/10/2023)   Glycopyrrolate 1 MG/5ML SOLN Take 5 mLs (1 mg total) by mouth in the morning, at noon, and at bedtime.   loperamide (IMODIUM) 2 MG capsule Take by mouth. (Patient not taking: Reported on 12/10/2023)   oseltamivir (TAMIFLU) 75 MG capsule Take 1 capsule (75 mg total) by mouth 2 (two) times daily. (Patient not taking: Reported on 12/10/2023)   trihexyphenidyl (ARTANE) 2 MG tablet Take 1 mg by mouth 2 (two) times daily with a meal. Taking  1/4 tablet once a day (Patient not taking: Reported on 12/10/2023)   No facility-administered encounter medications on file as of 12/10/2023.    Allergies (verified) Patient has no known allergies.   History: Past Medical History:  Diagnosis Date   Dysphonia    Past Surgical History:  Procedure Laterality Date   TONSILLECTOMY     Family History  Problem Relation Age of Onset   Hypertension Mother    Diabetes Mother    Hypertension Father    Diabetes Father    Stroke Father    Cancer Brother 50       prostate ,     Social History   Socioeconomic History   Marital status: Married    Spouse name: Not on file   Number of children: Not on file   Years of education: Not on file   Highest education level: Not on file  Occupational History   Occupation: attorney    Employer: [employer]  Tobacco Use   Smoking status: Former    Types: Cigars   Smokeless tobacco: Never   Tobacco comments:    1 cigar a month  Vaping Use   Vaping status: Never Used  Substance and Sexual Activity   Alcohol use: Not Currently    Alcohol/week: 1.0 standard drink of alcohol    Types: 1 Standard drinks or equivalent per week    Comment: twice a month   Drug use: No   Sexual activity: Not on file  Other Topics Concern   Not on file  Social History Narrative   Not on file   Social Drivers of Health   Financial Resource Strain: Low Risk  (12/10/2023)   Overall Financial Resource Strain (CARDIA)    Difficulty of Paying Living Expenses: Not hard at all  Food Insecurity: No Food Insecurity (12/10/2023)   Hunger Vital Sign    Worried About Running Out of Food in the Last Year: Never true    Ran Out of Food in the Last Year: Never true  Transportation Needs: No Transportation Needs (12/10/2023)   PRAPARE - Administrator, Civil Service (Medical): No    Lack of Transportation (Non-Medical): No  Physical Activity: Sufficiently Active (12/10/2023)   Exercise Vital Sign    Days of Exercise per  Week: 5 days    Minutes of Exercise per Session: 60 min  Stress: No Stress Concern Present (12/10/2023)   [employer] of Occupational Health - Occupational Stress Questionnaire    Feeling of Stress : Not at all  Social Connections: Moderately Integrated (12/10/2023)   Social Connection and Isolation Panel [NHANES]    Frequency of Communication with Friends and Family: Once a week    Frequency of Social Gatherings with Friends and Family: Once a week    Attends Religious Services: More than 4 times per year    Active Member of [employer] or Organizations: Yes    Attends [employer]  or Organization Meetings: More than 4 times per year    Marital Status: Married    Tobacco Counseling Counseling given: Yes Tobacco comments: 1 cigar a month    Clinical Intake:  Pre-visit preparation completed: Yes  Pain : No/denies pain     BMI - recorded: 25.31 Nutritional Status: BMI 25 -29 Overweight Nutritional Risks: None Diabetes: No  No results found for: "HGBA1C"   How often do you need to have someone help you when you read instructions, pamphlets, or other written materials from your doctor or pharmacy?: 5 - Always (Wife helps w/medication)  Interpreter Needed?: No  Information entered by :: Alia T/CMA   Activities of Daily Living     12/10/2023   11:40 AM  In your present state of health, do you have any difficulty performing the following activities:  Hearing? 0  Vision? 0  Difficulty concentrating or making decisions? 1  Walking or climbing stairs? 1  Dressing or bathing? 0  Doing errands, shopping? 1  Comment wife helps Clinical research associate and eating ? N  Comment wife helps  Using the Toilet? Y  Comment alittle bit/wife helps  In the past six months, have you accidently leaked urine? Y  Do you have problems with loss of bowel control? N  Managing your Medications? N  Comment wife helps w/med magaement  Managing your Finances? Y  Comment wife helps w/finaces   Housekeeping or managing your Housekeeping? Y  Comment wife helps w/house keeping    Patient Care Team: Sherlene Shams, MD as PCP - General (Internal Medicine)  Indicate any recent Medical Services you may have received from other than [facility] providers in the past year (date may be approximate).     Assessment:   This is a routine wellness examination for [facility].  Hearing/Vision screen Hearing Screening - Comments:: Pt denies hearing def per wife Vision Screening - Comments:: Pt denies vision def w/glasses Pt goes to [employer]   Goals Addressed             This Visit's Progress    Follow up with Primary Care Provider   On track    As needed       Depression Screen     12/10/2023   11:37 AM 11/02/2023    1:11 PM 11/17/2022    2:40 PM 05/15/2022    1:06 PM 03/22/2022   10:21 AM 11/25/2021   11:33 AM 10/26/2021    4:14 PM  PHQ 2/9 Scores  PHQ - 2 Score 1 0 0 0 0 0 0  PHQ- 9 Score 6 0         Fall Risk     12/10/2023   11:30 AM 11/02/2023    1:11 PM 11/17/2022    2:40 PM 03/22/2022   10:21 AM 01/03/2022   11:46 AM  Fall Risk   Falls in the past year? 1 0 0 1 1  Number falls in past yr: 1 0 0 1 1  Injury with Fall? 0 0 0 0 0  Risk for fall due to : Impaired balance/gait;Impaired mobility No Fall Risks  History of fall(s) History of fall(s);Impaired balance/gait  Follow up Falls evaluation completed;Falls prevention discussed Falls evaluation completed Falls evaluation completed;Falls prevention discussed Falls evaluation completed Falls evaluation completed    MEDICARE RISK AT HOME:  Medicare Risk at Home Any stairs in or around the home?: No If so, are there any without handrails?: No Home free of loose throw  rugs in walkways, pet beds, electrical cords, etc?: Yes Adequate lighting in your home to reduce risk of falls?: Yes Life alert?: Yes Use of a cane, walker or w/c?: Yes Grab bars in the bathroom?: Yes Shower chair or bench in shower?: Yes Elevated  toilet seat or a handicapped toilet?: Yes  TIMED UP AND GO:  Was the test performed?  No  Cognitive Function: 6CIT completed    11/17/2022    3:09 PM  MMSE - Mini Mental State Exam  Not completed: Unable to complete        12/10/2023   11:49 AM  6CIT Screen  What Year? 0 points  What month? 0 points  What time? 3 points  Count back from 20 0 points  Months in reverse 2 points  Repeat phrase 10 points  Total Score 15 points    Immunizations Immunization History  Administered Date(s) Administered   Fluad Quad(high Dose 65+) 06/16/2022   Hepatitis A, Adult 12/06/1996, 10/10/1998   Influenza Nasal 09/13/2003   Influenza Split 08/31/2013   Influenza Whole 07/05/1996, 10/10/1998, 08/02/1999, 09/10/2000, 07/04/2001, 08/09/2005   Influenza,inj,Quad PF,6+ Mos 09/09/2016, 10/04/2017, 06/18/2018, 05/16/2019   Influenza-Unspecified 06/04/2006, 06/04/2012, 09/04/2015, 06/18/2021   MMR 02/02/1982   OPV 01/03/1987   PFIZER(Purple Top)SARS-COV-2 Vaccination 11/25/2019, 12/16/2019, 01/11/2021   Pfizer Covid-19 Vaccine Bivalent Booster 84yrs & up 06/14/2021   Pfizer(Comirnaty)Fall Seasonal Vaccine 12 years and older 06/16/2022   Td 01/03/1987, 12/07/1996   Tdap 04/18/2012   Typhoid Parenteral, 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   Yellow Fever 11/03/1987   Zoster Recombinant(Shingrix) 01/11/2021, 07/05/2021    Screening Tests Health Maintenance  Topic Date Due   Pneumonia Vaccine 14+ Years old (1 of 1 - PCV) Never done   DTaP/Tdap/Td (4 - Td or Tdap) 04/18/2022   COVID-19 Vaccine (6 - 2024-25 season) 12/25/2024 (Originally 05/06/2023)   INFLUENZA VACCINE  04/04/2024   Medicare Annual Wellness (AWV)  12/09/2024   Colonoscopy  08/23/2027   Hepatitis C Screening  Completed   Zoster Vaccines- Shingrix  Completed   HPV VACCINES  Aged Out    Health Maintenance  Health Maintenance Due  Topic Date Due   Pneumonia Vaccine 28+ Years old (1 of 1 - PCV) Never done   DTaP/Tdap/Td (4 - Td or  Tdap) 04/18/2022   Health Maintenance Items Addressed: See Nurse Notes,    Additional Screening:  Vision Screening: Recommended annual ophthalmology exams for early detection of glaucoma and other disorders of the eye.  Dental Screening: Recommended annual dental exams for proper oral hygiene  Community Resource Referral / Chronic Care Management: CRR required this visit?  No   CCM required this visit?  No     Plan:     I have personally reviewed and noted the following in the patient's chart:   Medical and social history Use of alcohol, tobacco or illicit drugs  Current medications and supplements including opioid prescriptions. Patient is not currently taking opioid prescriptions. Functional ability and status Nutritional status Physical activity Advanced directives List of other physicians Hospitalizations, surgeries, and ER visits in previous 12 months Vitals Screenings to include cognitive, depression, and falls Referrals and appointments  In addition, I have reviewed and discussed with patient certain preventive protocols, quality metrics, and best practice recommendations. A written personalized care plan for preventive services as well as general preventive health recommendations were provided to patient.     Arta Silence, CMA   12/10/2023   After Visit Summary: (MyChart) Due to this being a  telephonic visit, the after visit summary with patients personalized plan was offered to patient via MyChart   Notes: Please remember to get your Pneumonia/Tetanus Vaccine at your next appointment. FYI to PCP pt score a 15 on 6CIT

## 2023-12-10 NOTE — Patient Instructions (Signed)
 Nicholas Singleton , Thank you for taking time to come for your Medicare Wellness Visit. I appreciate your ongoing commitment to your health goals. Please review the following plan we discussed and let me know if I can assist you in the future.   Referrals/Orders/Follow-Ups/Clinician Recommendations: Please remember to get your Pneumonia/Tetanus Vaccine at your next appointment  This is a list of the screening recommended for you and due dates:  Health Maintenance  Topic Date Due   Pneumonia Vaccine (1 of 1 - PCV) Never done   DTaP/Tdap/Td vaccine (4 - Td or Tdap) 04/18/2022   COVID-19 Vaccine (6 - 2024-25 season) 12/25/2024*   Flu Shot  04/04/2024   Medicare Annual Wellness Visit  12/09/2024   Colon Cancer Screening  08/23/2027   Hepatitis C Screening  Completed   Zoster (Shingles) Vaccine  Completed   HPV Vaccine  Aged Out  *Topic was postponed. The date shown is not the original due date.    Advanced directives: (Copy Requested) Please bring a copy of your health care power of attorney and living will to the office to be added to your chart at your convenience. You can mail to [facility] [address] [place], [place] [address] or email to [email or web address]  Next Medicare Annual Wellness Visit scheduled for next year: Yes

## 2023-12-25 ENCOUNTER — Encounter: Payer: Self-pay | Admitting: Internal Medicine

## 2023-12-25 NOTE — Telephone Encounter (Signed)
 I don't believe we have a way of knowing that unless he gets blood typing or donates blood. I don't believe our office can do the blood typing.

## 2024-08-04 ENCOUNTER — Encounter: Payer: Self-pay | Admitting: Internal Medicine

## 2024-08-04 DIAGNOSIS — B372 Candidiasis of skin and nail: Secondary | ICD-10-CM

## 2024-08-04 MED ORDER — NYSTATIN 100000 UNIT/GM EX CREA: 1.0000 | TOPICAL_CREAM | Freq: Two times a day (BID) | CUTANEOUS | 0 refills | Status: AC

## 2024-08-04 MED ORDER — TRIAMCINOLONE ACETONIDE 0.1 % EX CREA: 1.0000 | TOPICAL_CREAM | Freq: Two times a day (BID) | CUTANEOUS | 0 refills | Status: AC

## 2024-10-01 ENCOUNTER — Ambulatory Visit
Admission: EM | Admit: 2024-10-01 | Discharge: 2024-10-01 | Disposition: A | Discharge status: Home / Self Care | Attending: Emergency Medicine | Admitting: Emergency Medicine

## 2024-10-01 DIAGNOSIS — H6123 Impacted cerumen, bilateral: Secondary | ICD-10-CM | POA: Diagnosis not present

## 2024-10-01 NOTE — ED Provider Notes (Signed)
 " Nicholas Singleton    CSN: [id number] Arrival date & time: 10/01/24  1645      History   Chief Complaint Chief Complaint  Patient presents with   Ear Fullness    HPI Nicholas Singleton is a 69 y.o. male.  Accompanied by his wife, patient presents with muffled hearing and ear fullness x 1 day.  He states he was watching TV yesterday and noticed that his hearing was decreased and that his ears felt full.  No fever, ear drainage, sore throat, cough.  No treatments at home.  The history is provided by the patient, the spouse and medical records.    Past Medical History:  Diagnosis Date   Dysphonia     Patient Active Problem List   Diagnosis Date Noted   Fever 11/14/2023   Influenza A (H1N1) 11/14/2023   Moderate Lewy body dementia without behavioral disturbance, psychotic disturbance, mood disturbance, or anxiety (HCC) 10/26/2021   Folliculitis 06/02/2021   History of total hip arthroplasty, left 10/07/2020   Primary osteoarthritis of right hip 10/07/2020   Tinea pedis of both feet 05/05/2020   Parkinson disease (HCC) 01/30/2020   Tendonitis involving left hip abductors 01/30/2020   Benzodiazepine dependence (HCC) 10/25/2019   Osteoporosis 11/15/2018   Painful orthopaedic hardware 11/15/2018   Tremor of both hands 11/15/2018   Hospital discharge follow-up 02/23/2018   Pathological fracture of left femur due to osteoporosis (HCC) 01/27/2018   Closed left hip fracture (HCC) 01/26/2018   Chronic insomnia 01/26/2018   Hip fracture (HCC) 01/26/2018   Generalized weakness 08/24/2017   Osteoarthritis of knee 04/20/2017   Hoarseness of voice 04/03/2017   Pain of right hip joint 10/26/2016   Loss of weight 02/07/2015   Onychomycosis 11/20/2013   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 Authorizing Provider  ALPRAZolam  (XANAX ) 0.5 MG tablet Take 0.25 mg by mouth as needed.    Yes [provider]  Calcium  Carb-Cholecalciferol  600-20 MG-MCG CHEW Chew by mouth.   Yes [provider]  carbidopa-levodopa (SINEMET IR) 25-100 MG tablet Take 1 tablet by mouth 3 (three) times daily. 01/15/23  Yes [provider]  clonazePAM  (KLONOPIN ) 0.5 MG tablet Take 1 tablet by mouth 4 (four) times daily as needed. 07/24/16  Yes [provider]  CVS ACETAMINOPHEN  EX ST 500 MG tablet TAKE 2 TABLETS (1,000 MG TOTAL) BY MOUTH EVERY 8 (EIGHT) HOURS 02/13/18  Yes [provider]  donepezil (ARICEPT) 10 MG tablet Take 10 mg by mouth at bedtime.   Yes [provider]  hydrocortisone 2.5 % cream APPLY TO THE FACE, SCALP AND CHEST ONCE DAILY 11/24/19  Yes [provider]  Multiple Vitamin (MULTIVITAMIN) tablet Take 1 tablet by mouth daily.   Yes [provider]  nystatin  cream (MYCOSTATIN ) Apply 1 Application topically 2 (two) times daily. 08/04/24  Yes Marylynn Verneita LITTIE, MD  triamcinolone  (KENALOG ) 0.025 % cream Apply 1 Application topically 2 (two) times daily. Do not use on face 03/22/22  Yes Arnett, Rollene MATSU, FNP  amantadine (SYMMETREL) 100 MG capsule Take by mouth. Patient not taking:  Reported on 12/10/2023    [provider]  Folic Acid -Cholecalciferol  09-4998 MG-UNIT TABS Take by mouth. Patient not taking: Reported on 12/10/2023    [provider]  Glycopyrrolate  1 MG/5ML SOLN Take 5 mLs (1 mg total) by mouth in the morning, at noon, and at bedtime. 05/15/22 08/18/22  Tullo, Teresa L, MD  ibuprofen (ADVIL,MOTRIN) 800 MG tablet Take 800 mg by mouth every 6 (six) hours as needed. for pain 05/01/18   [provider]   ipratropium (ATROVENT ) 0.03 % nasal spray Place 2 sprays into both nostrils every 12 (twelve) hours. 11/23/23   Tullo, Teresa L, MD  loperamide (IMODIUM) 2 MG capsule Take by mouth. Patient not taking: Reported on 12/10/2023    [provider]  oseltamivir  (TAMIFLU ) 75 MG capsule Take 1 capsule (75 mg total) by mouth 2 (two) times daily. Patient not taking: Reported on 12/10/2023 11/02/23   Kaur, Charanpreet, NP  polyethylene glycol powder (GLYCOLAX /MIRALAX ) 17 GM/SCOOP powder polyethylene glycol 3350  17 gram oral powder packet  TAKE 1 PACKET (17 G TOTAL) BY MOUTH ONCE DAILY FOR 3 DAYS MIX IN 4 8OUNCES OF FLUID PRIOR TO TAKING.    [provider]  triamcinolone  cream (KENALOG ) 0.1 % Apply 1 Application topically 2 (two) times daily. 08/04/24   Marylynn Verneita CROME, MD  trihexyphenidyl  (ARTANE ) 2 MG tablet Take 1 mg by mouth 2 (two) times daily with a meal. Taking 1/4 tablet once a day Patient not taking: Reported on 12/10/2023    [provider]    Family History Family History  Problem Relation Age of Onset   Hypertension Mother    Diabetes Mother    Hypertension Father    Diabetes Father    Stroke Father    Cancer Brother 75       prostate ,      Social History Social History[1]   Allergies   Patient has no known allergies.   Review of Systems Review of Systems  Constitutional:  Negative for chills and fever.  HENT:  Positive for hearing loss. Negative for ear discharge, ear pain and sore throat.   Respiratory:  Negative for cough and shortness of breath.      Physical Exam Triage Vital Signs ED Triage Vitals [10/01/24 1834]  Encounter Vitals Group     BP      Girls Systolic BP Percentile      Girls Diastolic BP Percentile      Boys Systolic BP Percentile      Boys Diastolic BP Percentile      Pulse      Resp      Temp      Temp src      SpO2      Weight      Height      Head Circumference      Peak Flow      Pain Score 0     Pain Loc       Pain Education      Exclude from Growth Chart    No data found.  Updated Vital Signs BP (!) 147/87 (BP Location: Right Arm)   Pulse 61   Temp 98.2 F (36.8 C) (Oral)   Resp 18   SpO2 97%   Visual Acuity Right Eye Distance:   Left Eye Distance:   Bilateral Distance:    Right Eye Near:   Left Eye Near:    Bilateral Near:     Physical Exam Constitutional:  General: He is not in acute distress. HENT:     Right Ear: There is impacted cerumen.     Left Ear: There is impacted cerumen.     Nose: Nose normal.     Mouth/Throat:     Mouth: Mucous membranes are moist.     Pharynx: Oropharynx is clear.  Cardiovascular:     Rate and Rhythm: Normal rate and regular rhythm.     Heart sounds: Normal heart sounds.  Pulmonary:     Effort: Pulmonary effort is normal. No respiratory distress.     Breath sounds: Normal breath sounds.  Neurological:     Mental Status: He is alert.      UC Treatments / Results  Labs (all labs ordered are listed, but only abnormal results are displayed) Labs Reviewed - No data to display  EKG   Radiology No results found.  Procedures Procedures (including critical care time)  Medications Ordered in UC Medications - No data to display  Initial Impression / Assessment and Plan / UC Course  I have reviewed the triage vital signs and the nursing notes.  Pertinent labs & imaging results that were available during my care of the patient were reviewed by me and considered in my medical decision making (see chart for details).    Bilateral cerumen impaction.  Cerumen removal attempted via irrigation by RN.  Cerumen partially removed in left ear but canal remains impacted.  Right ear cerumen firmly packed and unable to remove.  TMs not visualized today due to cerumen impaction.  Instructed patient to follow-up with an ENT.  Contact information for [facility] provided.  Patient and his wife agreed to plan of care.  Final Clinical  Impressions(s) / UC Diagnoses   Final diagnoses:  Bilateral impacted cerumen     Discharge Instructions      Unfortunately, we were not able to get the wax out of your ears completely today.  Please schedule an appointment with an ENT such as the one listed below.     ED Prescriptions   None    PDMP not reviewed this encounter.    [1]  Social History Tobacco Use   Smoking status: Former    Types: Cigars   Smokeless tobacco: Never   Tobacco comments:    1 cigar a month  Vaping Use   Vaping status: Never Used  Substance Use Topics   Alcohol use: Not Currently    Alcohol/week: 1.0 standard drink of alcohol    Types: 1 Standard drinks or equivalent per week    Comment: twice a month   Drug use: No     Corlis Burnard DEL, NP 10/01/24 1946  "

## 2024-10-01 NOTE — Discharge Instructions (Addendum)
 Unfortunately, we were not able to get the wax out of your ears completely today.  Please schedule an appointment with an ENT such as the one listed below.

## 2024-10-01 NOTE — ED Triage Notes (Signed)
 Bilateral ear fullness x 1 day.

## 2024-12-09 ENCOUNTER — Encounter
